# Patient Record
Sex: Male | Born: 1935 | Race: White | Hispanic: No | Marital: Married | Smoking: Former smoker
Health system: Southern US, Community
[De-identification: ages and names within clinical notes are randomized; demographics above are authoritative.]

## PROBLEM LIST (undated history)

## (undated) HISTORY — PX: CERVICAL DISCECTOMY: SHX98

## (undated) HISTORY — PX: CATARACT EXTRACTION: SUR2

---

## 2004-08-13 ENCOUNTER — Ambulatory Visit: Payer: Self-pay | Admitting: Internal Medicine

## 2005-06-02 ENCOUNTER — Ambulatory Visit: Payer: Self-pay | Admitting: Internal Medicine

## 2005-06-18 ENCOUNTER — Ambulatory Visit: Payer: Self-pay | Admitting: Internal Medicine

## 2005-07-30 ENCOUNTER — Ambulatory Visit: Payer: Self-pay | Admitting: Internal Medicine

## 2006-09-19 ENCOUNTER — Ambulatory Visit: Payer: Self-pay | Admitting: Internal Medicine

## 2007-04-24 ENCOUNTER — Encounter: Payer: Self-pay | Admitting: Internal Medicine

## 2007-05-16 ENCOUNTER — Telehealth: Payer: Self-pay | Admitting: Internal Medicine

## 2008-01-25 ENCOUNTER — Encounter: Payer: Self-pay | Admitting: Internal Medicine

## 2008-05-29 ENCOUNTER — Ambulatory Visit: Payer: Self-pay | Admitting: Internal Medicine

## 2008-05-29 DIAGNOSIS — Z85828 Personal history of other malignant neoplasm of skin: Secondary | ICD-10-CM

## 2008-05-29 DIAGNOSIS — K5730 Diverticulosis of large intestine without perforation or abscess without bleeding: Secondary | ICD-10-CM | POA: Insufficient documentation

## 2008-06-04 ENCOUNTER — Encounter (INDEPENDENT_AMBULATORY_CARE_PROVIDER_SITE_OTHER): Payer: Self-pay | Admitting: *Deleted

## 2008-06-12 ENCOUNTER — Encounter (INDEPENDENT_AMBULATORY_CARE_PROVIDER_SITE_OTHER): Payer: Self-pay | Admitting: *Deleted

## 2008-06-14 ENCOUNTER — Encounter (INDEPENDENT_AMBULATORY_CARE_PROVIDER_SITE_OTHER): Payer: Self-pay | Admitting: *Deleted

## 2008-08-05 ENCOUNTER — Telehealth (INDEPENDENT_AMBULATORY_CARE_PROVIDER_SITE_OTHER): Payer: Self-pay | Admitting: *Deleted

## 2008-08-06 ENCOUNTER — Telehealth (INDEPENDENT_AMBULATORY_CARE_PROVIDER_SITE_OTHER): Payer: Self-pay | Admitting: *Deleted

## 2009-02-07 ENCOUNTER — Encounter: Payer: Self-pay | Admitting: Internal Medicine

## 2009-12-20 ENCOUNTER — Encounter: Payer: Self-pay | Admitting: Internal Medicine

## 2009-12-20 ENCOUNTER — Emergency Department (HOSPITAL_COMMUNITY): Admission: EM | Admit: 2009-12-20 | Discharge: 2009-12-21 | Payer: Self-pay | Admitting: Emergency Medicine

## 2010-02-26 ENCOUNTER — Encounter: Payer: Self-pay | Admitting: Internal Medicine

## 2010-07-27 ENCOUNTER — Ambulatory Visit: Payer: Self-pay | Admitting: Internal Medicine

## 2010-08-14 ENCOUNTER — Ambulatory Visit: Payer: Self-pay | Admitting: Internal Medicine

## 2010-08-14 ENCOUNTER — Encounter: Payer: Self-pay | Admitting: Internal Medicine

## 2010-08-14 DIAGNOSIS — E785 Hyperlipidemia, unspecified: Secondary | ICD-10-CM | POA: Insufficient documentation

## 2010-08-14 DIAGNOSIS — Z860100 Personal history of colon polyps, unspecified: Secondary | ICD-10-CM | POA: Insufficient documentation

## 2010-08-14 LAB — CONVERTED CEMR LAB
HDL goal, serum: 40 mg/dL
LDL Goal: 160 mg/dL

## 2010-08-20 ENCOUNTER — Ambulatory Visit: Payer: Self-pay | Admitting: Internal Medicine

## 2010-08-24 LAB — CONVERTED CEMR LAB
Alkaline Phosphatase: 48 units/L (ref 39–117)
BUN: 13 mg/dL (ref 6–23)
Bilirubin, Direct: 0.2 mg/dL (ref 0.0–0.3)
Calcium: 8.8 mg/dL (ref 8.4–10.5)
Chloride: 101 meq/L (ref 96–112)
Creatinine, Ser: 0.9 mg/dL (ref 0.4–1.5)
Eosinophils Absolute: 0.1 10*3/uL (ref 0.0–0.7)
Eosinophils Relative: 2.5 % (ref 0.0–5.0)
GFR calc non Af Amer: 85.1 mL/min (ref >60)
Glucose, Bld: 84 mg/dL (ref 70–99)
HCT: 45.5 % (ref 39.0–52.0)
Lymphocytes Relative: 28.1 % (ref 12.0–46.0)
Lymphs Abs: 1.5 10*3/uL (ref 0.7–4.0)
Monocytes Absolute: 0.6 10*3/uL (ref 0.1–1.0)
Monocytes Relative: 10.6 % (ref 3.0–12.0)
Neutro Abs: 3.2 10*3/uL (ref 1.4–7.7)
Platelets: 196 10*3/uL (ref 150.0–400.0)
Potassium: 4.2 meq/L (ref 3.5–5.1)
RDW: 13.5 % (ref 11.5–14.6)
Sodium: 139 meq/L (ref 135–145)
TSH: 2.09 microintl units/mL (ref 0.35–5.50)
Total Bilirubin: 0.9 mg/dL (ref 0.3–1.2)
Total CHOL/HDL Ratio: 2
Total Protein: 6.3 g/dL (ref 6.0–8.3)

## 2010-10-25 ENCOUNTER — Encounter: Payer: Self-pay | Admitting: Emergency Medicine

## 2010-11-01 LAB — CONVERTED CEMR LAB
AST: 26 units/L (ref 0–37)
BUN: 9 mg/dL (ref 6–23)
Basophils Absolute: 0 10*3/uL (ref 0.0–0.1)
CO2: 33 meq/L — ABNORMAL HIGH (ref 19–32)
Calcium: 9.1 mg/dL (ref 8.4–10.5)
Chloride: 110 meq/L (ref 96–112)
Creatinine, Ser: 1 mg/dL (ref 0.4–1.5)
Eosinophils Relative: 2.6 % (ref 0.0–5.0)
Hemoglobin: 15.5 g/dL (ref 13.0–17.0)
Lymphocytes Relative: 31.3 % (ref 12.0–46.0)
Neutro Abs: 2.4 10*3/uL (ref 1.4–7.7)
Neutrophils Relative %: 55.6 % (ref 43.0–77.0)
RBC: 4.89 M/uL (ref 4.22–5.81)
RDW: 12.3 % (ref 11.5–14.6)
Sodium: 144 meq/L (ref 135–145)
TSH: 1.62 microintl units/mL (ref 0.35–5.50)
WBC: 4.2 10*3/uL — ABNORMAL LOW (ref 4.5–10.5)

## 2010-11-03 NOTE — Assessment & Plan Note (Signed)
Summary: FLU SHOT///SPH  Nurse Visit  CC: Flu shot./kb   Orders Added: 1)  Admin 1st Vaccine [90471] 2)  Flu Vaccine 64yrs + [72536]       Flu Vaccine Consent Questions     Do you have a history of severe allergic reactions to this vaccine? no    Any prior history of allergic reactions to egg and/or gelatin? no    Do you have a sensitivity to the preservative Thimersol? no    Do you have a past history of Guillan-Barre Syndrome? no    Do you currently have an acute febrile illness? no    Have you ever had a severe reaction to latex? no    Vaccine information given and explained to patient? yes    Are you currently pregnant? no    Lot Number:[id number]   Exp Date:04/03/2011   Site Given  Right Deltoid IM

## 2010-11-03 NOTE — Miscellaneous (Signed)
Summary: [facility]  [facility]   Imported By: Lanelle Bal 12/25/2009 10:28:44  _____________________________________________________________________  External Attachment:    Type:   Image     Comment:   External Document

## 2010-11-03 NOTE — Letter (Signed)
Summary: [facility]  [facility]   Imported By: Lanelle Bal 03/09/2010 14:05:09  _____________________________________________________________________  External Attachment:    Type:   Image     Comment:   External Document

## 2010-11-03 NOTE — Assessment & Plan Note (Signed)
Summary: YEARLY--PNEUMONIA//PH   Vital Signs:  Patient profile:   77 year old male Height:      69.25 inches Weight:      189.6 pounds BMI:     27.90 Temp:     98.3 degrees F oral Pulse rate:   76 / minute Resp:     14 per minute BP sitting:   124 / 62  (left arm) Cuff size:   large  Vitals Entered By: Shonna Chock CMA (August 14, 2010 1:23 PM) CC: Yearly follow-up and pneumonia vaccine, Lipid Management   CC:  Yearly follow-up and pneumonia vaccine and Lipid Management.  History of Present Illness: Here for Medicare AWV: 1.   Risk factors based on Past M, S, F history:Remote history radiation exposure; Diverticulosis;  colon polyps; Dyslipidemia ( chart updated) 2.   Physical Activities:  treadmill @ varible speeds & incline 25 min 6X/ week 3.   Depression/mood: no issue 4.   Hearing: whisper @ 6 ft  intact  5.   ADL's: no limitations 6.   Fall Risk: none  7.   Home Safety:safety proofed  8.   Height, weight, &visual acuity:vision intact @ 6 ft with lenses 9.   Counseling: POA & Living Will in place 10.   Labs ordered based on risk factors: see Orders  11.           Referral Coordination: none requested  12.           Care Plan: see Instructions  13.            Cognitive Assessment: Oriented X 3;  memory & recall excellent  ; "WORLD" spelled backwards ; mood & affect intact. Hyperlipidemia Follow-Up       The patient denies the following symptoms: chest pain/pressure, exercise intolerance, dypsnea, palpitations, syncope, and pedal edema.  Dietary compliance has been good.  Adjunctive measures currently used by the patient include niacin, fish oil supplements, and Co-QA.   NMR Lipoprofile Panel reviewed .[place] Study LDL = < 160.  Lipid Management History:      Positive NCEP/ATP III risk factors include male age 64 years old or older.  Negative NCEP/ATP III risk factors include non-diabetic, no family history for ischemic heart disease, non-tobacco-user status,  non-hypertensive, no ASHD (atherosclerotic heart disease), no prior stroke/TIA, no peripheral vascular disease, and no history of aortic aneurysm.     Preventive Screening-Counseling & Management  Alcohol-Tobacco     Alcohol drinks/day: 2-3     Year Quit: 1980  Caffeine-Diet-Exercise     Caffeine use/day: 2 cups or <  Hep-HIV-STD-Contraception     Dental Visit-last 6 months yes     Sun Exposure-Excessive: no  Safety-Violence-Falls     Seat Belt Use: yes     Smoke Detectors: yes      Blood Transfusions:  no.        Travel History:  [place] 2010.    Current Medications (verified): 1)  Cialis 20 Mg Tabs (Tadalafil) .... 1/2 To 1 Every 3 Days As Needed 2)  Selenium 100 Mcg Tabs (Selenium) .Marland Kitchen.. 1 By Mouth Once Daily 3)  Fish Oil 1000 Mg Caps (Omega-3 Fatty Acids) .Marland Kitchen.. 1 By Mouth Once Daily 4)  Coq10 50 Mg Caps (Coenzyme Q10) .Marland Kitchen.. 1 By Mouth Once Daily 5)  Grape Seed Extract 60 Mg Caps (Grape Seed) .Marland Kitchen.. 1 By Mouth Once Daily 6)  Saw Palmetto 500 Mg Caps (Saw Palmetto (Serenoa Repens)) .Marland Kitchen.. 1 By Mouth Once Daily 7)  Copper Caps 2 Mg Caps (Copper Gluconate) .Marland Kitchen.. 1 By Mouth Once Daily 8)  Calcium/magnesium/zinc .... 1 By Mouth  Once Daily 9)  Nicacinamide .Marland Kitchen.. 1 By Mouth Once Daily  Allergies (verified): No Known Drug Allergies  Past History:  Past Medical History: Gilbert's Syndrome ; Fuch's Disease (Ophth), Dr Elmer Picker; PMH prostatitis X 2, Dr [place] Service Skin cancer, PMH  of basal cell cancer  Diverticulosis, colon Colonic polyps,PMH  of Hyperlipidemia: [place] Study LDL goal = < 160. NMR Lipoprofile suggests LDL goal = < 100 optimally.  Past Surgical History: Cervical fusion & Cervical laminectomy 1998, Dr Venetia Maxon Colon polypectomy, Tics 2005; negative  2008  Tonsillectomy Cataract extraction bilaterally , Dr Elmer Picker Facial vascular Nevus treated with Radium in 09-15-30  Family History: Father: oral  cancer Mother: CVAs in 80s,HTN Siblings: bro: DM, PVD ; bro  :cns  cancer  & asthma; sister: DJD  Social History: Occupation: Attorney Married Alcohol use-yes: socially Caffeine use/day:  2 cups or < Dental Care w/in 6 mos.:  yes Sun Exposure-Excessive:  no Risk analyst Use:  yes Blood Transfusions:  no  Review of Systems  The patient denies anorexia, fever, weight loss, weight gain, hoarseness, prolonged cough, hemoptysis, abdominal pain, melena, hematochezia, severe indigestion/heartburn, hematuria, suspicious skin lesions, unusual weight change, abnormal bleeding, enlarged lymph nodes, and angioedema.    Physical Exam  General:  well-nourished;alert,appropriate and cooperative throughout examination Head:  Normocephalic and atraumatic without obvious abnormalities.  Eyes:  No corneal or conjunctival inflammation noted.Perrla. Funduscopic exam benign, without hemorrhages, exudates or papilledema. Ears:  External ear exam shows no significant lesions or deformities.  Otoscopic examination reveals clear canals, tympanic membranes are intact bilaterally without bulging, retraction, inflammation or discharge. Hearing is grossly normal bilaterally. Nose:  External nasal examination shows no deformity or inflammation. Nasal mucosa are pink and moist without lesions or exudates. Mouth:  Oral mucosa and oropharynx without lesions or exudates.  Teeth in good repair. Neck:  No deformities, masses, or tenderness noted. Lungs:  Normal respiratory effort, chest expands symmetrically. Lungs are clear to auscultation, no crackles or wheezes. Heart:  Normal rate and regular rhythm. S1 and S2 normal without gallop, murmur, click, rub or other extra sounds. Abdomen:  Bowel sounds positive,abdomen soft and non-tender without masses, organomegaly or hernias noted. Lipoma R abdomen which  transilluminates Genitalia:  [facility]:  No deformity or scoliosis noted of thoracic or lumbar spine.   Pulses:  R and L carotid,radial,dorsalis pedis and posterior tibial  pulses are full and equal bilaterally Extremities:  No clubbing, cyanosis, edema. Deformed R 5th digit. Minimal OA changes in hands. Ganglion cysts L medial foot Neurologic:  alert & oriented X3 and DTRs symmetrical and normal.   Skin:  Intact without suspicious lesions or rashes Cervical Nodes:  No lymphadenopathy noted Axillary Nodes:  No palpable lymphadenopathy Psych:  memory intact for recent and remote, normally interactive, and good eye contact.     Impression & Recommendations:  Problem # 1:  PREVENTIVE HEALTH CARE (ICD-V70.0)  Orders: [facility] -Subsequent Annual Wellness Visit [phone])  Problem # 2:  HYPERLIPIDEMIA (ICD-272.4)   Minimal LDL goal = < 160   Orders: EKG w/ Interpretation (93000)  Problem # 3:  EXPOSURE TO UNSPECIFIED RADIATION (ICD-E926.9) L face in 1931  Problem # 4:  COLONIC POLYPS, HX OF (ICD-V12.72) as per GI  Complete Medication List: 1)  Cialis 20 Mg Tabs (Tadalafil) .... 1/2 to 1 every 3 days as needed 2)  Selenium 100 Mcg  Tabs (Selenium) .Marland Kitchen.. 1 by mouth once daily 3)  Fish Oil 1000 Mg Caps (Omega-3 fatty acids) .Marland Kitchen.. 1 by mouth once daily 4)  Coq10 50 Mg Caps (Coenzyme q10) .Marland Kitchen.. 1 by mouth once daily 5)  Grape Seed Extract 60 Mg Caps (Grape seed) .Marland Kitchen.. 1 by mouth once daily 6)  Saw Palmetto 500 Mg Caps (Saw palmetto (serenoa repens)) .Marland Kitchen.. 1 by mouth once daily 7)  Copper Caps 2 Mg Caps (Copper gluconate) .Marland Kitchen.. 1 by mouth once daily 8)  Calcium/magnesium/zinc  .... 1 by mouth  once daily 9)  Nicacinamide  .Marland KitchenMarland Kitchen. 1 by mouth once daily  Other Orders: Pneumococcal Vaccine (56433) Admin 1st Vaccine (29518)  Lipid Assessment/Plan:      Based on NCEP/ATP III, the patient's risk factor category is "0-1 risk factors".  The patient's lipid goals are as follows: Total cholesterol goal is 200; LDL cholesterol goal is 160; HDL cholesterol goal is 40; Triglyceride goal is 150.    Patient Instructions: 1)  Schedue fasting labs : 2)  It is important that you  exercise regularly at least 20 minutes 5 times a week. If you develop chest pain, have severe difficulty breathing, or feel very tired , stop exercising immediately and seek medical attention. 3)  Take an  81 mg coated Aspirin every day. 4)  BMP : 272.4 5)  Hepatic Panel: 272.4 6)  Lipid Panel :272.5 7)  TSH : 272.4,E926.9 8)  CBC w/ Diff :211.3   Orders Added: 1)  Pneumococcal Vaccine [90732] 2)  Admin 1st Vaccine [90471] 3)  [facility] -Subsequent Annual Wellness Visit [G0439] 4)  Est. Patient Level III [84166] 5)  EKG w/ Interpretation [93000]   Immunizations Administered:  Pneumonia Vaccine:    Vaccine Type: Pneumovax (Medicare)    Site: right deltoid    Mfr: Merck    Dose: 0.5 ml    Route: IM    Given by: Shonna Chock CMA    Exp. Date: 12/19/2011    Lot #: [id number]   Immunizations Administered:  Pneumonia Vaccine:    Vaccine Type: Pneumovax (Medicare)    Site: right deltoid    Mfr: Merck    Dose: 0.5 ml    Route: IM    Given by: Shonna Chock CMA    Exp. Date: 12/19/2011    Lot #: [id number]

## 2012-11-04 ENCOUNTER — Encounter (HOSPITAL_BASED_OUTPATIENT_CLINIC_OR_DEPARTMENT_OTHER): Payer: Self-pay

## 2012-11-04 ENCOUNTER — Emergency Department (HOSPITAL_BASED_OUTPATIENT_CLINIC_OR_DEPARTMENT_OTHER)
Admission: EM | Admit: 2012-11-04 | Discharge: 2012-11-04 | Disposition: A | Discharge status: Home / Self Care | Payer: Medicare Other | Attending: Emergency Medicine | Admitting: Emergency Medicine

## 2012-11-04 DIAGNOSIS — M26609 Unspecified temporomandibular joint disorder, unspecified side: Secondary | ICD-10-CM | POA: Insufficient documentation

## 2012-11-04 DIAGNOSIS — Z87891 Personal history of nicotine dependence: Secondary | ICD-10-CM | POA: Insufficient documentation

## 2012-11-04 DIAGNOSIS — R52 Pain, unspecified: Secondary | ICD-10-CM | POA: Insufficient documentation

## 2012-11-04 NOTE — ED Notes (Signed)
Pt states that he woke up with localized swelling to the R cheek, and it has since improved greatly, pain decr from onset this morning.  Pt states that it was very tender at onset, and continues to be sensitive.

## 2012-11-04 NOTE — ED Provider Notes (Signed)
History     CSN: [id number]  Arrival date & time 11/04/12  1018   First MD Initiated Contact with Patient 11/04/12 1031      Chief Complaint  Patient presents with  . Facial Swelling    HPI The patient had an episode of acute pain this morning after yawning in his right TMJ region. The pain was sharp and severe.  It started this morning but has gradually resolved. Patient denies any other symptoms prior to this occurring. He's not having any fevers or difficulty swallowing. The patient noticed that that right TMJ region was red and swollen after it . his wife took a picture of it.  The patient has been able to eat since that time. He occasionally has some pain when he opens his jaw widely but otherwise denies any other regular symptoms.  The symptoms all much resolved at this point.  Patient wanted to get it checked out he was concerned about the possibility of a ruptured blood vessel. He did note that when he took his blood pressure was elevated. Generally he does not have any trouble with hypertension. History reviewed. No pertinent past medical history.  Past Surgical History  Procedure Date  . Cervical discectomy     anterior approach  . Cataract extraction     History reviewed. No pertinent family history.  History  Substance Use Topics  . Smoking status: Former Smoker    Quit date: 10/04/1978  . Smokeless tobacco: Never Used  . Alcohol Use: 1.8 oz/week    3 Glasses of wine per week      Review of Systems  Constitutional: Negative for fever.  HENT: Negative for drooling, mouth sores, trouble swallowing and dental problem.   Respiratory: Negative for shortness of breath.   All other systems reviewed and are negative.    Allergies  Review of patient's allergies indicates no known allergies.  Home Medications  No current outpatient prescriptions on file.  BP 162/66  Pulse 81  Temp 97.5 F (36.4 C) (Oral)  Resp 20  Ht 5\' 10"  (1.778 m)  Wt 192 lb (87.091 kg)   BMI 27.55 kg/m2  SpO2 99%  Physical Exam  Nursing note and vitals reviewed. Constitutional: He appears well-developed and well-nourished. No distress.  HENT:  Head: Normocephalic and atraumatic. No trismus in the jaw.  Right Ear: External ear normal.  Left Ear: External ear normal.  Mouth/Throat: Oropharynx is clear and moist and mucous membranes are normal. Normal dentition. No dental abscesses or uvula swelling.       Mild ttp right tmj joint, full range of motion, no malocclusion  Eyes: Conjunctivae normal are normal. Right eye exhibits no discharge. Left eye exhibits no discharge. No scleral icterus.  Neck: Neck supple. No tracheal deviation present.  Cardiovascular: Normal rate.   Pulmonary/Chest: Effort normal. No stridor. No respiratory distress.  Musculoskeletal: He exhibits no edema.  Neurological: He is alert. Cranial nerve deficit: no gross deficits.  Skin: Skin is warm and dry. No rash noted.  Psychiatric: He has a normal mood and affect.    ED Course  Procedures (including critical care time)  Labs Reviewed - No data to display No results found.   1. TMJ (temporomandibular joint disorder)       MDM  Patient is no evidence of infection on exam. He has normal dentition. I am not concerned that this is some sort of referred pain such as an associated cardiac condition and that he did noticed swelling and  had discrete tenderness at the right TMJ joint. I suspect he may have had some pain associated with a partial TMJ joint dislocation/subluxation. The patient did notice the discomfort immediately after yawning. He did feel that it was swollen and red. It is now essentially resolved. I explained to him that an acute blood vessel issue or acute infection or not spontaneously get better.  I recommended observation and followup with his primary doctor or dentist. Return to emergency room as needed for any worsening symptoms. I recommended avoid opening his mouth widely over  the next few days.        Celene Kras, MD 11/04/12 [phone]

## 2013-05-04 ENCOUNTER — Emergency Department (HOSPITAL_BASED_OUTPATIENT_CLINIC_OR_DEPARTMENT_OTHER)
Admission: EM | Admit: 2013-05-04 | Discharge: 2013-05-04 | Disposition: A | Discharge status: Home / Self Care | Payer: Medicare Other | Attending: Emergency Medicine | Admitting: Emergency Medicine

## 2013-05-04 ENCOUNTER — Encounter (HOSPITAL_BASED_OUTPATIENT_CLINIC_OR_DEPARTMENT_OTHER): Payer: Self-pay | Admitting: *Deleted

## 2013-05-04 ENCOUNTER — Emergency Department (HOSPITAL_BASED_OUTPATIENT_CLINIC_OR_DEPARTMENT_OTHER): Payer: Medicare Other

## 2013-05-04 DIAGNOSIS — R51 Headache: Secondary | ICD-10-CM | POA: Insufficient documentation

## 2013-05-04 DIAGNOSIS — J40 Bronchitis, not specified as acute or chronic: Secondary | ICD-10-CM

## 2013-05-04 DIAGNOSIS — Z87891 Personal history of nicotine dependence: Secondary | ICD-10-CM | POA: Insufficient documentation

## 2013-05-04 DIAGNOSIS — J209 Acute bronchitis, unspecified: Secondary | ICD-10-CM | POA: Insufficient documentation

## 2013-05-04 MED ORDER — AZITHROMYCIN 250 MG PO TABS: ORAL_TABLET | ORAL | Status: DC

## 2013-05-04 NOTE — ED Provider Notes (Signed)
  CSN: [id number]     Arrival date & time 05/04/13  1605 History     First MD Initiated Contact with Patient 05/04/13 1629     Chief Complaint  Patient presents with  . URI   (Consider location/radiation/quality/duration/timing/severity/associated sxs/prior Treatment) Patient is a 79 y.o. male presenting with cough. The history is provided by the patient. No language interpreter was used.  Cough Cough characteristics:  Productive Sputum characteristics:  Nondescript Severity:  Moderate Onset quality:  Gradual Duration:  10 days Timing:  Constant Progression:  Worsening Chronicity:  New Smoker: no   Context: upper respiratory infection   Relieved by:  Nothing Associated symptoms: headaches   Associated symptoms: no chest pain and no fever   Pt report he has been coughinh gor the past 10 days  History reviewed. No pertinent past medical history. Past Surgical History  Procedure Laterality Date  . Cervical discectomy      anterior approach  . Cataract extraction     No family history on file. History  Substance Use Topics  . Smoking status: Former Smoker    Quit date: 10/04/1978  . Smokeless tobacco: Never Used  . Alcohol Use: 1.8 oz/week    3 Glasses of wine per week    Review of Systems  Constitutional: Negative for fever.  Respiratory: Positive for cough.   Cardiovascular: Negative for chest pain.  Neurological: Positive for headaches.  All other systems reviewed and are negative.    Allergies  Review of patient's allergies indicates no known allergies.  Home Medications  No current outpatient prescriptions on file. BP 160/70  Pulse 80  Temp(Src) 98.2 F (36.8 C) (Oral)  Resp 18  Wt 187 lb (84.823 kg)  BMI 26.83 kg/m2  SpO2 99% Physical Exam  Vitals reviewed. Constitutional: He appears well-developed and well-nourished.  HENT:  Head: Normocephalic.  Right Ear: External ear normal.  Left Ear: External ear normal.  Nose: Nose normal.   Mouth/Throat: Oropharynx is clear and moist.  Eyes: Pupils are equal, round, and reactive to light.  Neck: Normal range of motion.  Cardiovascular: Normal rate.   Pulmonary/Chest: Effort normal.  Abdominal: Soft.  Musculoskeletal: Normal range of motion.  Neurological: He is alert.  Skin: Skin is warm.    ED Course   Procedures (including critical care time)  Labs Reviewed - No data to display Dg Chest 2 View  05/04/2013   *RADIOLOGY REPORT*  Clinical Data: 10-day history of cough and rhinitis.  CHEST - 2 VIEW  Comparison: None.  Findings: Cardiomediastinal silhouette unremarkable for age, with only mild thoracic aortic atherosclerosis.   Lungs clear. Bronchovascular markings normal.  Pulmonary vascularity normal.  No pneumothorax.  No pleural effusions.  Degenerative changes and DISH involving the thoracic spine.  Old healed fractures involving posterior left ribs.  IMPRESSION: No acute cardiopulmonary disease.   Original Report Authenticated By: Hulan Saas, M.D.   1. Bronchitis     MDM  zithromax   Lonia Skinner [place], [place] 05/04/13 [phone]

## 2013-05-04 NOTE — ED Provider Notes (Signed)
Medical screening examination/treatment/procedure(s) were performed by non-physician practitioner and as supervising physician I was immediately available for consultation/collaboration.   Gavin Pound. Oletta Lamas, MD 05/04/13 2002

## 2013-05-04 NOTE — ED Notes (Signed)
Coughing x 10 days. Runny nose. No fever. Denies headache. States he has a summer cold.

## 2016-07-20 ENCOUNTER — Other Ambulatory Visit: Payer: Self-pay | Admitting: Physician Assistant

## 2016-07-20 DIAGNOSIS — R160 Hepatomegaly, not elsewhere classified: Secondary | ICD-10-CM

## 2016-08-04 ENCOUNTER — Ambulatory Visit
Admission: RE | Admit: 2016-08-04 | Discharge: 2016-08-04 | Disposition: A | Discharge status: Home / Self Care | Payer: Medicare Other | Source: Ambulatory Visit | Attending: Physician Assistant | Admitting: Physician Assistant

## 2016-08-04 DIAGNOSIS — R160 Hepatomegaly, not elsewhere classified: Secondary | ICD-10-CM

## 2016-08-04 MED ORDER — IOPAMIDOL (ISOVUE-300) INJECTION 61%
100.0000 mL | Freq: Once | INTRAVENOUS | Status: AC | PRN
  Administered 2016-08-04: 100 mL via INTRAVENOUS

## 2016-08-06 ENCOUNTER — Other Ambulatory Visit (HOSPITAL_COMMUNITY): Payer: Self-pay | Admitting: Physician Assistant

## 2016-08-06 DIAGNOSIS — R160 Hepatomegaly, not elsewhere classified: Secondary | ICD-10-CM

## 2016-08-19 ENCOUNTER — Other Ambulatory Visit (HOSPITAL_COMMUNITY): Payer: Self-pay | Admitting: Physician Assistant

## 2016-08-19 DIAGNOSIS — R160 Hepatomegaly, not elsewhere classified: Secondary | ICD-10-CM

## 2016-08-23 ENCOUNTER — Telehealth: Payer: Self-pay | Admitting: *Deleted

## 2016-08-23 NOTE — Telephone Encounter (Signed)
Oncology Nurse Navigator Documentation  Oncology Nurse Navigator Flowsheets 08/23/2016  Navigator Location [facility]  Referral date to RadOnc/MedOnc 08/23/2016  Navigator Encounter Type Telephone  Telephone Incoming Call;Appt Confirmation/Clarification  Abnormal Finding Date 08/04/2016  Call from Dr. Barry Dienes to request putting patient in for [facility] on 09/03/16 to see Dr. Benay Spice and herself. Gave appointment with Dr. Benay Spice for 09/03/16 at 0830 and Dr. Barry Dienes at [facility]. She will tell patient today. Sent message to HIM to schedule in EPIC.

## 2016-08-30 ENCOUNTER — Other Ambulatory Visit: Payer: Self-pay | Admitting: Radiology

## 2016-08-31 ENCOUNTER — Ambulatory Visit (HOSPITAL_COMMUNITY): Admission: RE | Admit: 2016-08-31 | Payer: Medicare Other | Source: Ambulatory Visit

## 2016-08-31 ENCOUNTER — Encounter: Payer: Self-pay | Admitting: *Deleted

## 2016-08-31 NOTE — Progress Notes (Signed)
Notified by Raquel Sarna with [facility] that patient has decided against any treatment-surgery or chemotherapy. He has cancelled his appointments.

## 2016-09-03 ENCOUNTER — Ambulatory Visit: Payer: Medicare Other | Admitting: Oncology

## 2017-11-18 ENCOUNTER — Ambulatory Visit (INDEPENDENT_AMBULATORY_CARE_PROVIDER_SITE_OTHER): Payer: Medicare Other

## 2017-11-18 ENCOUNTER — Encounter (INDEPENDENT_AMBULATORY_CARE_PROVIDER_SITE_OTHER): Payer: Self-pay | Admitting: Orthopaedic Surgery

## 2017-11-18 ENCOUNTER — Ambulatory Visit (INDEPENDENT_AMBULATORY_CARE_PROVIDER_SITE_OTHER): Payer: Medicare Other | Admitting: Orthopaedic Surgery

## 2017-11-18 DIAGNOSIS — M7022 Olecranon bursitis, left elbow: Secondary | ICD-10-CM

## 2017-11-18 NOTE — Progress Notes (Signed)
Office Visit Note   Patient: Kenneth Woodward           Date of Birth: [birth year]           MRN: [id number] Visit Date: 11/18/2017              Requested by: Chesley Noon, MD [place], [place] [id number] PCP: Chesley Noon, MD   Assessment & Plan: Visit Diagnoses:  1. Olecranon bursitis of left elbow     Plan: Impression is left olecranon bursitis.  Recommend warm compresses and symptomatic treatment to see if this will improve it or  resolve in a couple weeks.  If not better we can consider aspiration cortisone injection.  Patient is in agreement.  Follow-up as needed.  Follow-Up Instructions: Return if symptoms worsen or fail to improve.   Orders:  Orders Placed This Encounter  Procedures  . XR Elbow 2 Views Left   No orders of the defined types were placed in this encounter.     Procedures: No procedures performed   Clinical Data: No additional findings.   Subjective: Chief Complaint  Patient presents with  . Left Elbow - Pain, Edema    Patient is a 84 year old gentleman who is well-known to me who comes in with swelling and mass over the left elbow.  He is not exactly sure how long he has had this.  His daughter noticed it.  Denies any pain.  Denies any constitutional symptoms.  Denies any trauma.    Review of Systems  Constitutional: Negative.   All other systems reviewed and are negative.    Objective: Vital Signs: There were no vitals taken for this visit.  Physical Exam  Constitutional: He is oriented to person, place, and time. He appears well-developed and well-nourished.  HENT:  Head: Normocephalic and atraumatic.  Eyes: Pupils are equal, round, and reactive to light.  Neck: Neck supple.  Pulmonary/Chest: Effort normal.  Abdominal: Soft.  Musculoskeletal: Normal range of motion.  Neurological: He is alert and oriented to person, place, and time.  Skin: Skin is warm.  Psychiatric: He has a normal mood and affect.  His behavior is normal. Judgment and thought content normal.  Nursing note and vitals reviewed.   Ortho Exam Left elbow exam shows a swollen olecranon bursa.  There is no overlying skin changes or cellulitis.  There is no tenderness to palpation.  Full function of the elbow. Specialty Comments:  No specialty comments available.  Imaging: Xr Elbow 2 Views Left  Result Date: 11/18/2017 Large soft tissue density overlying the olecranon.    PMFS History: Patient Active Problem List   Diagnosis Date Noted  . HYPERLIPIDEMIA 08/14/2010  . COLONIC POLYPS, HX OF 08/14/2010  . DIVERTICULOSIS, COLON 05/29/2008  . SKIN CANCER, HX OF 05/29/2008   No past medical history on file.  No family history on file.  Past Surgical History:  Procedure Laterality Date  . CATARACT EXTRACTION    . CERVICAL DISCECTOMY     anterior approach   Social History   Occupational History  . Not on file  Tobacco Use  . Smoking status: Former Smoker    Last attempt to quit: 10/04/1978    Years since quitting: 39.1  . Smokeless tobacco: Never Used  Substance and Sexual Activity  . Alcohol use: Yes    Alcohol/week: 1.8 oz    Types: 3 Glasses of wine per week  . Drug use: No  . Sexual activity: Not on file

## 2018-05-03 ENCOUNTER — Telehealth: Payer: Self-pay | Admitting: Nurse Practitioner

## 2018-05-03 ENCOUNTER — Encounter: Payer: Self-pay | Admitting: Nurse Practitioner

## 2018-05-03 ENCOUNTER — Inpatient Hospital Stay: Payer: Medicare Other | Attending: Nurse Practitioner | Admitting: Nurse Practitioner

## 2018-05-03 ENCOUNTER — Inpatient Hospital Stay: Payer: Medicare Other

## 2018-05-03 VITALS — BP 135/57 | HR 76 | Temp 98.4°F | Resp 19 | Ht 70.0 in | Wt 170.4 lb

## 2018-05-03 DIAGNOSIS — R61 Generalized hyperhidrosis: Secondary | ICD-10-CM | POA: Diagnosis not present

## 2018-05-03 DIAGNOSIS — C220 Liver cell carcinoma: Secondary | ICD-10-CM | POA: Insufficient documentation

## 2018-05-03 DIAGNOSIS — Z87891 Personal history of nicotine dependence: Secondary | ICD-10-CM | POA: Diagnosis not present

## 2018-05-03 LAB — CMP (CANCER CENTER ONLY)
ALK PHOS: 131 U/L — AB (ref 38–126)
ALT: 60 U/L — ABNORMAL HIGH (ref 0–44)
AST: 91 U/L — ABNORMAL HIGH (ref 15–41)
Albumin: 3.9 g/dL (ref 3.5–5.0)
Anion gap: 8 (ref 5–15)
BUN: 18 mg/dL (ref 8–23)
CALCIUM: 9.4 mg/dL (ref 8.9–10.3)
CHLORIDE: 103 mmol/L (ref 98–111)
CO2: 29 mmol/L (ref 22–32)
Creatinine: 0.89 mg/dL (ref 0.61–1.24)
GFR, Estimated: >60 mL/min (ref >60)
Glucose, Bld: 93 mg/dL (ref 70–99)
Potassium: 4.2 mmol/L (ref 3.5–5.1)
SODIUM: 140 mmol/L (ref 135–145)
Total Bilirubin: 0.7 mg/dL (ref 0.3–1.2)
Total Protein: 7.2 g/dL (ref 6.5–8.1)

## 2018-05-03 LAB — CBC WITH DIFFERENTIAL (CANCER CENTER ONLY)
BASOS PCT: 1 %
Basophils Absolute: 0 10*3/uL (ref 0.0–0.1)
Eosinophils Absolute: 0.2 10*3/uL (ref 0.0–0.5)
Eosinophils Relative: 3 %
HCT: 34 % — ABNORMAL LOW (ref 38.4–49.9)
Hemoglobin: 11.1 g/dL — ABNORMAL LOW (ref 13.0–17.1)
Lymphocytes Relative: 21 %
Lymphs Abs: 1.2 10*3/uL (ref 0.9–3.3)
MCH: 28.2 pg (ref 27.2–33.4)
MCHC: 32.5 g/dL (ref 32.0–36.0)
MCV: 86.6 fL (ref 79.3–98.0)
MONOS PCT: 10 %
Monocytes Absolute: 0.6 10*3/uL (ref 0.1–0.9)
NEUTROS ABS: 3.8 10*3/uL (ref 1.5–6.5)
Neutrophils Relative %: 65 %
Platelet Count: 191 10*3/uL (ref 140–400)
RBC: 3.93 MIL/uL — ABNORMAL LOW (ref 4.20–5.82)
RDW: 15 % — ABNORMAL HIGH (ref 11.0–14.6)
WBC Count: 5.8 10*3/uL (ref 4.0–10.3)

## 2018-05-03 LAB — PROTIME-INR
INR: 0.99
PROTHROMBIN TIME: 13 s (ref 11.4–15.2)

## 2018-05-03 NOTE — Telephone Encounter (Signed)
New referral received [facility] for dx of liver mass. Pt has been scheduled to see Marlynn Perking today at 215pm. Pt aware to arrive 30 minutes early.

## 2018-05-03 NOTE — Progress Notes (Addendum)
New Hematology/Oncology Consult   Requesting MD: Dr. Anastasia Pall  [phone]    Reason for Consult: Hepatocellular carcinoma  HPI: Mr. Hewins is an 84 year old man recently referred for evaluation of newly diagnosed hepatocellular carcinoma.  He had a CT scan of the abdomen/pelvis 08/04/2016 that showed multiple left hepatic lobe lesions.  The largest lesion measured 8.3 x 6.9 cm and was centered segment 4 of the liver.  Smaller lesion exophytic from the inferior anterior margin of the hepatic lobe measuring 3.5 x 3.2 cm.  Contiguous 3.6 cm exophytic lesion along the falciform ligament.  Cystic lesion in the right hepatic lobe without enhancement.  Additional cystic lesion in the central right hepatic lobe with enhancing septation measuring 19 mm.  Multiple additional cystic lesions noted. There was a soft tissue implant in the subcutaneous tissue of the right abdominal wall measuring 2.6 cm.  He declined further evaluation.  He had a CT scan at [facility] on 04/04/2018 showing a very large mass spanning the right and left lobes of the liver measuring approximately 15.2 x 10.7 x 14.8 cm.  Additional smaller masses noted adjacent to the primary lesion, one lateral to the lesion measuring 3.5 x 3 cm and one lesion possibly contiguous with the primary mass measuring 6.2 x 3.8 cm.  All of these had increased in size when compared to the previous examination.  There was a cystic lesion in the lateral right lobe of the liver with multiple septations and some calcifications within the septations.  This lesion was not significantly changed in size from the prior examination.  There was a soft tissue mass in the subcutaneous tissues of the anterior abdominal wall to the right of midline just below the umbilicus, not significantly changed from the prior.  He underwent liver mass biopsy on 04/27/2018.  Final pathology showed hepatocellular carcinoma.     History reviewed. No pertinent past medical  history.:  Past Surgical History:  Procedure Laterality Date  . CATARACT EXTRACTION    . CERVICAL DISCECTOMY     anterior approach  :  No current outpatient medications on file.:  :  Allergies  Allergen Reactions  . Adhesive [Tape] Other (See Comments)    Band-aids irritate skin. Ok to use gauze with paper tape  :  FH: Father deceased with cancer at age 27.  Mother deceased age 60 as well.  Brother deceased with a brain tumor.  Brother deceased, health described as poor.  SOCIAL HISTORY: Mr. Jolley lives in [place].  He is married.  He has 2 children.  His daughter has Parkinson's disease.  He is employed as an Forensic psychologist.  He quit smoking cigarettes in 1980.  He drinks 1 to 2 glasses of wine a day.  He has never had a blood transfusion.  Review of Systems: He reports intermittent drenching night sweats for the past 6 months.  The sweats mainly affect his chest area.  No associated fever.  He describes a good appetite.  His wife has noted that he is eating less. She thinks he has lost some weight.  He denies pain.  No unusual headaches.  He notes gradual decrease in vision. 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  No dysphagia.  He has dyspnea on exertion.  No chest pain.  No leg swelling or calf pain.  No nausea or vomiting.  No constipation or diarrhea.  He has nocturia.  No hematuria or dysuria.  No rash.  No numbness or  tingling in his hands or feet.   Physical Exam:  Blood pressure (!) 135/57, pulse 76, temperature 98.4 F (36.9 C), temperature source Oral, resp. rate 19, height 5\' 10"  (1.778 m), weight 170 lb 6.4 oz (77.3 kg), SpO2 96 %.  HEENT: No thrush or ulcers. Lungs: Lungs clear bilaterally. Cardiac: Regular rate and rhythm. Abdomen: Liver palpable throughout the right upper abdomen.  Palpable mass upper mid abdomen.  Vascular: Trace edema at the lower leg/ankle bilaterally. Lymph nodes: No palpable cervical, supraclavicular  or axillary lymph nodes. Neurologic: Alert and oriented.  Follows commands.  Motor strength 5/5. Skin: Approximate 2 cm subcutaneous lesion right mid/lower abdominal wall.  LABS:  None  Assessment and Plan:  1. Hepatocellular carcinoma  CT scan of the abdomen/pelvis 08/04/2016-multiple left hepatic lobe lesions.  The largest lesion measured 8.3 x 6.9 cm and was centered segment 4 of the liver.  Smaller lesion exophytic from the inferior anterior margin of the hepatic lobe measuring 3.5 x 3.2 cm.  Contiguous 3.6 cm exophytic lesion along the falciform ligament.  Cystic lesion in the right hepatic lobe without enhancement.  Additional cystic lesion in the central right hepatic lobe with enhancing septation measuring 19 mm.  Multiple additional cystic lesions noted. There was a soft tissue implant in the subcutaneous tissue of the right abdominal wall measuring 2.6 cm.    CT scan abdomen[facility]pelvis [facility] 04/04/2018-very large mass spanning the right and left lobes of the liver measuring approximately 15.2 x 10.7 x 14.8 cm.  Additional smaller masses noted adjacent to the primary lesion, one lateral to the lesion measuring 3.5 x 3 cm and one lesion possibly contiguous with the primary mass measuring 6.2 x 3.8 cm.  All of these had increased in size when compared to the previous examination.  There was a cystic lesion in the lateral right lobe of the liver with multiple septations and some calcifications within the septations.  This lesion was not significantly changed in size from the prior examination.  There was a soft tissue mass in the subcutaneous tissues of the anterior abdominal wall to the right of midline just below the umbilicus, not significantly changed from the prior.   2. Night sweats likely secondary to #1  Disposition: Mr. Rittenhouse appears to have multifocal hepatocellular carcinoma.  Dr. Benay Spice reviewed the diagnosis and potential treatment options with Mr. Zuckerman and his wife.  They  understand that surgical resection is most likely not an option.  His case will be presented at the upcoming GI tumor conference.  We are referring him for a staging CT scan of the chest and will obtain labs to include a CBC, chemistry panel, PT, AFP, hepatitis B and C serologies.  He will return for a follow-up visit on 05/11/2018 to review the outstanding data and establish a treatment plan.  For the night sweats he will try Aleve at bedtime.  Patient seen with Dr. Benay Spice.  CT images reviewed on the computer with Mr. Santilli and his wife.   Ned Card, NP 05/03/2018, 2:47 PM   This was a shared visit with Ned Card. Mr. Ortwein was interviewed and examined.  He has multifocal HCC.  We reviewed the 2017 CT images(waiting on access to recent [facility] images) and discussed treatment options.  I will present his case at the GI tumor conference next week and he will undergo additional staging evaluation.  He will most likely be treated with hepatic embolization or systemic therapy.  Julieanne Manson, MD

## 2018-05-03 NOTE — Progress Notes (Signed)
Met with patient and wife during initial consult with Ned Card NP and Dr. Benay Spice. I was able to speak with patient privately after appointment in lobby. The role of navigation discussed and contact information provided. CT scan scheduled for 05/10/18 @ 2:30 PM [facility] radiology. Patient aware to arrive at 2:15 PM. All questions answered to patient's satisfaction.

## 2018-05-04 ENCOUNTER — Ambulatory Visit
Admission: RE | Admit: 2018-05-04 | Discharge: 2018-05-04 | Disposition: A | Discharge status: Home / Self Care | Payer: Medicare Other | Source: Ambulatory Visit | Attending: Oncology | Admitting: Oncology

## 2018-05-04 ENCOUNTER — Other Ambulatory Visit: Payer: Self-pay | Admitting: Oncology

## 2018-05-04 ENCOUNTER — Telehealth: Payer: Self-pay

## 2018-05-04 DIAGNOSIS — C4490 Unspecified malignant neoplasm of skin, unspecified: Secondary | ICD-10-CM

## 2018-05-04 LAB — HEPATITIS B SURFACE ANTIGEN: HEP B S AG: NEGATIVE

## 2018-05-04 LAB — AFP TUMOR MARKER: AFP, Serum, Tumor Marker: 73.8 ng/mL — ABNORMAL HIGH (ref 0.0–8.3)

## 2018-05-04 LAB — HCV COMMENT:

## 2018-05-04 LAB — HEPATITIS B CORE ANTIBODY, TOTAL: Hep B Core Total Ab: NEGATIVE

## 2018-05-04 LAB — HEPATITIS C ANTIBODY (REFLEX)

## 2018-05-04 NOTE — Telephone Encounter (Signed)
Printed avs and calender of upcoming appointment per 8/1 los

## 2018-05-10 ENCOUNTER — Ambulatory Visit (HOSPITAL_COMMUNITY)
Admission: RE | Admit: 2018-05-10 | Discharge: 2018-05-10 | Disposition: A | Discharge status: Home / Self Care | Payer: Medicare Other | Source: Ambulatory Visit | Attending: Nurse Practitioner | Admitting: Nurse Practitioner

## 2018-05-10 ENCOUNTER — Encounter (HOSPITAL_COMMUNITY): Payer: Self-pay

## 2018-05-10 DIAGNOSIS — C220 Liver cell carcinoma: Secondary | ICD-10-CM | POA: Insufficient documentation

## 2018-05-10 DIAGNOSIS — R160 Hepatomegaly, not elsewhere classified: Secondary | ICD-10-CM | POA: Diagnosis not present

## 2018-05-10 DIAGNOSIS — I700 Atherosclerosis of aorta: Secondary | ICD-10-CM | POA: Insufficient documentation

## 2018-05-11 ENCOUNTER — Inpatient Hospital Stay: Payer: Medicare Other | Attending: Nurse Practitioner | Admitting: Nurse Practitioner

## 2018-05-11 ENCOUNTER — Telehealth: Payer: Self-pay | Admitting: Oncology

## 2018-05-11 VITALS — BP 129/56 | HR 73 | Temp 98.3°F | Resp 17 | Ht 70.0 in | Wt 170.6 lb

## 2018-05-11 DIAGNOSIS — Z87891 Personal history of nicotine dependence: Secondary | ICD-10-CM | POA: Diagnosis not present

## 2018-05-11 DIAGNOSIS — R61 Generalized hyperhidrosis: Secondary | ICD-10-CM | POA: Diagnosis not present

## 2018-05-11 DIAGNOSIS — C220 Liver cell carcinoma: Secondary | ICD-10-CM | POA: Diagnosis present

## 2018-05-11 NOTE — Telephone Encounter (Signed)
Appointments scheduled AVS/Calendar printed per 8/8 los °

## 2018-05-11 NOTE — Progress Notes (Signed)
[place] OFFICE PROGRESS NOTE   Diagnosis: Hepatocellular carcinoma  INTERVAL HISTORY:   Kenneth Woodward returns as scheduled.  No significant abdominal pain.  Appetite is unchanged.  He continues to work.  No recent sweats.  Objective:  Vital signs in last 24 hours:  Blood pressure (!) 129/56, pulse 73, temperature 98.3 F (36.8 C), temperature source Oral, resp. rate 17, height 5\' 10"  (1.778 m), weight 170 lb 9.6 oz (77.4 kg), SpO2 98 %.    Resp: Lungs clear bilaterally. Cardio: Regular rate and rhythm. GI: Liver palpable throughout the right upper abdomen.  Palpable mass upper mid abdomen. Vascular: Trace edema at the lower legs bilaterally. Neuro: Alert and oriented.   Lab Results:  Lab Results  Component Value Date   WBC 5.8 05/03/2018   HGB 11.1 (L) 05/03/2018   HCT 34.0 (L) 05/03/2018   MCV 86.6 05/03/2018   PLT 191 05/03/2018   NEUTROABS 3.8 05/03/2018    Imaging:  Ct Chest Wo Contrast  Result Date: 05/11/2018 CLINICAL DATA:  Newly diagnosed hepatocellular carcinoma, shortness of breath with exertion x2 years EXAM: CT CHEST WITHOUT CONTRAST TECHNIQUE: Multidetector CT imaging of the chest was performed following the standard protocol without IV contrast. COMPARISON:  Outside hospital CT abdomen/pelvis dated 04/04/2018 FINDINGS: Cardiovascular: Heart is normal in size.  No pericardial effusion. No evidence of thoracic aortic aneurysm. Atherosclerotic calcifications of the aortic arch. Three vessel coronary atherosclerosis. Mediastinum/Nodes: Small mediastinal lymph nodes which do not meet pathologic CT size criteria. Visualized thyroid is unremarkable. Lungs/Pleura: No suspicious pulmonary nodules. Calcified granuloma in the posterior right upper lobe (series 5/image 44), benign. No focal consolidation. Mild biapical pleural-parenchymal scarring. No pleural effusion or pneumothorax. Upper Abdomen: Visualized upper abdomen is notable for a nodular hepatic  contour with multifocal hepatic masses in the left hepatic lobe, including a dominant aggregate mass measuring approximately 18.0 x 11.3 cm in axial dimension, similar to the prior although poorly evaluated on unenhanced CT. Musculoskeletal: Degenerative changes of the visualized thoracolumbar spine. IMPRESSION: No evidence of metastatic disease in the chest. Multifocal hepatic masses, poorly evaluated on unenhanced CT but grossly unchanged. Aortic Atherosclerosis (ICD10-I70.0). Electronically Signed   By: Julian Hy M.D.   On: 05/11/2018 08:39    Medications: I have reviewed the patient's current medications.  Assessment/Plan: 1. Hepatocellular carcinoma  CT scan of the abdomen/pelvis 08/04/2016-multiple left hepatic lobe lesions.  The largest lesion measured 8.3 x 6.9 cm and was centered segment 4 of the liver.  Smaller lesion exophytic from the inferior anterior margin of the hepatic lobe measuring 3.5 x 3.2 cm.  Contiguous 3.6 cm exophytic lesion along the falciform ligament.  Cystic lesion in the right hepatic lobe without enhancement.  Additional cystic lesion in the central right hepatic lobe with enhancing septation measuring 19 mm.  Multiple additional cystic lesions noted. There was a soft tissue implant in the subcutaneous tissue of the right abdominal wall measuring 2.6 cm.    CT scan abdomen/pelvis at [facility] 04/04/2018-very large mass spanning the right and left lobes of the liver measuring approximately 15.2 x 10.7 x 14.8 cm.  Additional smaller masses noted adjacent to the primary lesion, one lateral to the lesion measuring 3.5 x 3 cm and one lesion possibly contiguous with the primary mass measuring 6.2 x 3.8 cm.  All of these had increased in size when compared to the previous examination.  There was a cystic lesion in the lateral right lobe of the liver with multiple septations and some  calcifications within the septations.  This lesion was not significantly changed in size from the  prior examination.  There was a soft tissue mass in the subcutaneous tissues of the anterior abdominal wall to the right of midline just below the umbilicus, not significantly changed from the prior.  CT-guided liver biopsy ([facility]) 04/27/2018-hepatocellular carcinoma  05/03/2018 AFP 73  Chest CT 05/10/2018- no evidence of metastatic disease in the chest.  Multifocal hepatic masses, grossly unchanged.   2. Night sweats likely secondary to #1  Disposition: Kenneth Woodward has multifocal hepatocellular carcinoma.   Dr. Benay Spice reviewed the diagnosis, prognosis and treatment options with Kenneth Woodward and his family.  They understand no therapy will be curative.  His case was presented at the recent GI tumor conference.  Surgery is not felt to be an option.  We discussed embolization as well as systemic treatment options.  Dr. Benay Spice recommends a referral to interventional radiology to consider embolization.  Kenneth Woodward agrees with this plan.  He and his family are also interested in a palliative care referral.  We will make a referral to the [facility] palliative care program.  We also made a referral to the [place] dietitian.  We reviewed the labs from 05/03/2018 as well as the chest CT from 05/10/2018.  CT images, including the chest CT and outside CT from 04/04/2018, were reviewed on the computer with Kenneth Woodward and his family.  He will return for a follow-up visit in approximately 6 weeks.  He and his family understand to contact the office in the interim with any problems.  Patient seen with Dr. Benay Spice.  45 minutes were spent face-to-face at today's visit with the majority of that time involved in counseling/coordination of care.     Ned Card ANP/GNP-BC   05/11/2018  2:44 PM  This was a shared visit with Ned Card.  Kenneth Woodward has been diagnosed with multifocal hepatocellular carcinoma.  He is not a surgical candidate.  His case was presented at the GI tumor conference on  05/10/2018.  The recommendation is to proceed with embolization therapy.  I discussed treatment options with Kenneth Woodward and multiple family members.  He understands no therapy will be curative.  We discussed observation/supportive care, hepatic directed therapy, and systemic therapy.  He states that he does wish to proceed with treatment.  He agrees to a consultation with interventional radiology to consider radioembolization treatment.  I discussed the general treatment plan for Y-90 therapy.  We also discussed the availability of systemic therapy with lenvatinib and immunotherapy if the tumor does not respond to embolization.  Kenneth Woodward requested a home palliative care evaluation.  He will return for an office visit here in 6 weeks.  Julieanne Manson, MD

## 2018-05-15 ENCOUNTER — Telehealth: Payer: Self-pay

## 2018-05-15 DIAGNOSIS — C220 Liver cell carcinoma: Secondary | ICD-10-CM

## 2018-05-15 NOTE — Telephone Encounter (Signed)
VM left for patients wife to schedule visit with Palliative Care. 

## 2018-05-15 NOTE — Telephone Encounter (Signed)
Spoke with patient's wife who felt that patient was not in need of palliative care at this time. Wife will contact Dr. Gearldine Shown office if [place] is needed in the future

## 2018-05-15 NOTE — Progress Notes (Signed)
Pt's wife called requesting a nutrition referral. Referral placed.

## 2018-05-16 ENCOUNTER — Ambulatory Visit
Admission: RE | Admit: 2018-05-16 | Discharge: 2018-05-16 | Disposition: A | Discharge status: Home / Self Care | Payer: Medicare Other | Source: Ambulatory Visit | Attending: Nurse Practitioner | Admitting: Nurse Practitioner

## 2018-05-16 DIAGNOSIS — C220 Liver cell carcinoma: Secondary | ICD-10-CM

## 2018-05-16 HISTORY — PX: IR RADIOLOGIST EVAL & MGMT: IMG5224

## 2018-05-16 NOTE — Consult Note (Signed)
Chief Complaint: [place] diagnosis.   Referring Physician(s): Chesley Noon, PCP [facility]  Oncology: Dr. Benay Spice Surgery: Dr. Leighton Ruff  History of Present Illness: Drystan Reader is a 84 y.o. male presenting as a scheduled consultation to Vascular & Interventional Radiology for evaluation of candidacy for liver-directed therapy of a newly diagnosed [place].    Mr Noseworthy is here today with his wife for the consultation.    He is a gentleman who is in excellent health, and does not take any medications.  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  He has been in practice for over 60 years.  He has no significant symptoms.   He was referred to our oncology team here at [facility] by his physicians at [facility], as I understand it, for geographic ease of access to an intensifying treatment schedule.  His [place] diagnosis was made on recent CT imaging and a percutaneous biopsy, performed in [facility].    CT imaging is available from 04/04/2018 and 05/05/2018.  There is additional CT imaging performed 08/04/2016.    His case was recently reviewed at the Gastro-enterology tumor conference, where input was solicited from GI, oncologic surgery, oncology, VIR, and all staff present.  He was determined not to be a surgical candidate.     I did ask him about the CT performed in 2017, that demonstrated the tumor.  He did not have a work-up at that time.    He continues to be very active, and in fact is a music lover, enjoying the harmonica.  He tells me he carries a harmonica with him everywhere.   His MELD is calculated 6, with estimated 3 month mortality of <2% His CP is calculated A, number 5, essentially normal.   His liver labs are essentially normal, with only slight elevation of the AST and ALT.   AFP is 74. Negative HCV and HBV studies.     No past medical history on file.  Past Surgical History:  Procedure Laterality Date  . CATARACT EXTRACTION    .  CERVICAL DISCECTOMY     anterior approach    Allergies: Adhesive [tape]  Medications: Prior to Admission medications   Not on File     No family history on file.  Social History   Socioeconomic History  . Marital status: Married    Spouse name: Not on file  . Number of children: Not on file  . Years of education: Not on file  . Highest education level: Not on file  Occupational History  . Not on file  Social Needs  . Financial resource strain: Not on file  . Food insecurity:    Worry: Not on file    Inability: Not on file  . Transportation needs:    Medical: Not on file    Non-medical: Not on file  Tobacco Use  . Smoking status: Former Smoker    Last attempt to quit: 10/04/1978    Years since quitting: 39.6  . Smokeless tobacco: Never Used  Substance and Sexual Activity  . Alcohol use: Yes    Alcohol/week: 3.0 standard drinks    Types: 3 Glasses of wine per week  . Drug use: No  . Sexual activity: Not on file  Lifestyle  . Physical activity:    Days per week: Not on file    Minutes per session: Not on file  . Stress: Not on file  Relationships  . Social connections:    Talks  on phone: Not on file    Gets together: Not on file    Attends religious service: Not on file    Active member of club or organization: Not on file    Attends meetings of clubs or organizations: Not on file    Relationship status: Not on file  Other Topics Concern  . Not on file  Social History Narrative  . Not on file    ECOG Status: 0 - Asymptomatic  Review of Systems: A 12 point ROS discussed and pertinent positives are indicated in the HPI above.  All other systems are negative.  Review of Systems  Vital Signs: BP 126/61   Pulse 62   Temp 97.9 F (36.6 C) (Oral)   Resp 15   Ht 5\' 10"  (1.778 m)   Wt 75.8 kg   SpO2 97%   BMI 23.96 kg/m   Physical Exam General: 84 yo male appearing younger than stated age.  Well-developed, well-nourished, well-kempt.  No  distress. HEENT: Atraumatic, normocephalic.  Conjugate gaze, extra-ocular motor intact. No scleral icterus or scleral injection. No lesions on external ears, nose, lips, or gums.  Oral mucosa moist, pink.  Neck: Symmetric with no goiter enlargement.  Chest/Lungs:  Symmetric chest with inspiration/expiration.  No labored breathing.  Clear to auscultation with no wheezes, rhonchi, or rales.  Heart:  RRR, with no third heart sounds appreciated. No JVD appreciated.  Abdomen:  Soft, NT/ND, with + bowel sounds.   Genito-urinary: Deferred Neurologic: Alert & Oriented to person, place, and time.   Normal affect and insight.  Appropriate questions.  Moving all 4 extremities with gross sensory intact.   Mallampati Score:  2  Imaging: Ct Chest Wo Contrast  Result Date: 05/11/2018 CLINICAL DATA:  Newly diagnosed hepatocellular carcinoma, shortness of breath with exertion x2 years EXAM: CT CHEST WITHOUT CONTRAST TECHNIQUE: Multidetector CT imaging of the chest was performed following the standard protocol without IV contrast. COMPARISON:  Outside hospital CT abdomen/pelvis dated 04/04/2018 FINDINGS: Cardiovascular: Heart is normal in size.  No pericardial effusion. No evidence of thoracic aortic aneurysm. Atherosclerotic calcifications of the aortic arch. Three vessel coronary atherosclerosis. Mediastinum/Nodes: Small mediastinal lymph nodes which do not meet pathologic CT size criteria. Visualized thyroid is unremarkable. Lungs/Pleura: No suspicious pulmonary nodules. Calcified granuloma in the posterior right upper lobe (series 5/image 44), benign. No focal consolidation. Mild biapical pleural-parenchymal scarring. No pleural effusion or pneumothorax. Upper Abdomen: Visualized upper abdomen is notable for a nodular hepatic contour with multifocal hepatic masses in the left hepatic lobe, including a dominant aggregate mass measuring approximately 18.0 x 11.3 cm in axial dimension, similar to the prior although  poorly evaluated on unenhanced CT. Musculoskeletal: Degenerative changes of the visualized thoracolumbar spine. IMPRESSION: No evidence of metastatic disease in the chest. Multifocal hepatic masses, poorly evaluated on unenhanced CT but grossly unchanged. Aortic Atherosclerosis (ICD10-I70.0). Electronically Signed   By: Julian Hy M.D.   On: 05/11/2018 08:39    Labs:  CBC: Recent Labs    05/03/18 1558  WBC 5.8  HGB 11.1*  HCT 34.0*  PLT 191    COAGS: Recent Labs    05/03/18 1558  INR 0.99    BMP: Recent Labs    05/03/18 1558  NA 140  K 4.2  CL 103  CO2 29  GLUCOSE 93  BUN 18  CALCIUM 9.4  CREATININE 0.89  GFRNONAA >60  GFRAA >60    LIVER FUNCTION TESTS: Recent Labs    05/03/18 1558  BILITOT 0.7  AST 91*  ALT 60*  ALKPHOS 131*  PROT 7.2  ALBUMIN 3.9    TUMOR MARKERS: No results for input(s): AFPTM, CEA, CA199, CHROMGRNA in the last 8760 hours.  Assessment and Plan:  Mr Veselka is a very pleasant 84 year old male with new diagnosis of [place], essentially normal liver lab values, and ECOG of 0.    His BCLC stage, despite a large tumor burden and multi-focality, is Intermediate stage, as his ECOG is 0 and there is no evidence of local nodal disease, no mets, and no portal vein invasion.    I had a lengthy discussion with Mr Saavedra and his wife regarding his imaging, broadly-available treatment options for [facility], and specifically treatment that VIR can offer.  I was very clear when I told him that surgical options are the only available cure, and they understand.    My impression is that with his excellent lab values and general absence of background liver disease, being aggressive with liver directed therapy is a reasonable approach, if that was what he wanted as an alternative to expectant/medical care.  He is interested in aggressive therapy plan.    Given the multi-focal lesions and the size, embolic treatment is indicated, and I think y90 therapy is  the most reasonable.  This will be the most well tolerated, will have the least amount of negative effect on his baseline liver function, with better toxicity profile than chemoembolization, and has been shown to be effective in improved time-to-progression.   I discussed the logistics of y90, which included a discussion about planning/mapping angiogram before therapy, a tentative plan for 1 to 2 treatment after mapping, and an overall treatment time-frame of about 6 weeks or so from mapping to the last planned treatment. After that, I did let them know that the next planned CT scan would be in about 3 months.   Specific risks discussed included: bleeding, infection, arterial problem, non-target embolization, abscess, GI ulceration, need for further surgery/procedure, hospitalization, liver failure, cardiopulmonary collapse, death.   After our discussion, he would like to proceed with y90 mapping and treatment with Dr. Earleen Newport.   Plan: - Plan for y90 mapping, to include possible embolization of non-target arteries and possible high flow shunts - Plan for 1 to 2 y90 therapy doses - I have encouraged him to observe his follow up with his other physicians.   Electronically Signed: Corrie Mckusick 05/16/2018, 4:45 PM   I spent a total of  40 Minutes   in face to face in clinical consultation, greater than 50% of which was counseling/coordinating care for new diagnosis of [place], possible angiogram, possible y90 therapy.

## 2018-05-17 ENCOUNTER — Encounter: Payer: Self-pay | Admitting: *Deleted

## 2018-05-23 DIAGNOSIS — C220 Liver cell carcinoma: Secondary | ICD-10-CM

## 2018-05-23 NOTE — Progress Notes (Signed)
Called pt's wife to discuss available supports for her as she processes pt's diagnosis. Referral to social work placed.

## 2018-05-24 ENCOUNTER — Other Ambulatory Visit (HOSPITAL_COMMUNITY): Payer: Self-pay | Admitting: Interventional Radiology

## 2018-05-24 DIAGNOSIS — C220 Liver cell carcinoma: Secondary | ICD-10-CM

## 2018-05-26 ENCOUNTER — Encounter: Payer: Self-pay | Admitting: General Practice

## 2018-05-26 NOTE — Progress Notes (Signed)
[place] CSW Progress Note  Met w wife Vaughan Basta for caregiver support.  Resources and encouragement provided.  Edwyna Shell, LCSW Clinical Social Worker Phone:  [phone]

## 2018-06-08 ENCOUNTER — Other Ambulatory Visit: Payer: Self-pay | Admitting: Student

## 2018-06-09 ENCOUNTER — Ambulatory Visit (HOSPITAL_COMMUNITY)
Admission: RE | Admit: 2018-06-09 | Discharge: 2018-06-09 | Disposition: A | Discharge status: Home / Self Care | Payer: Medicare Other | Source: Ambulatory Visit | Attending: Interventional Radiology | Admitting: Interventional Radiology

## 2018-06-09 ENCOUNTER — Other Ambulatory Visit (HOSPITAL_COMMUNITY): Payer: Self-pay | Admitting: Interventional Radiology

## 2018-06-09 ENCOUNTER — Other Ambulatory Visit: Payer: Self-pay

## 2018-06-09 ENCOUNTER — Encounter (HOSPITAL_COMMUNITY): Payer: Self-pay

## 2018-06-09 ENCOUNTER — Encounter (HOSPITAL_COMMUNITY)
Admission: RE | Admit: 2018-06-09 | Discharge: 2018-06-09 | Disposition: A | Discharge status: Home / Self Care | Payer: Medicare Other | Source: Ambulatory Visit | Attending: Interventional Radiology | Admitting: Interventional Radiology

## 2018-06-09 DIAGNOSIS — C220 Liver cell carcinoma: Secondary | ICD-10-CM

## 2018-06-09 HISTORY — PX: IR EMBO ARTERIAL NOT HEMORR HEMANG INC GUIDE ROADMAPPING: IMG5448

## 2018-06-09 HISTORY — PX: IR ANGIOGRAM SELECTIVE EACH ADDITIONAL VESSEL: IMG667

## 2018-06-09 HISTORY — PX: IR ANGIOGRAM VISCERAL SELECTIVE: IMG657

## 2018-06-09 HISTORY — PX: IR US GUIDE VASC ACCESS RIGHT: IMG2390

## 2018-06-09 HISTORY — PX: IR EMBO TUMOR ORGAN ISCHEMIA INFARCT INC GUIDE ROADMAPPING: IMG5449

## 2018-06-09 LAB — COMPREHENSIVE METABOLIC PANEL
ALBUMIN: 3.9 g/dL (ref 3.5–5.0)
ALK PHOS: 126 U/L (ref 38–126)
ALT: 42 U/L (ref 0–44)
ANION GAP: 13 (ref 5–15)
AST: 79 U/L — ABNORMAL HIGH (ref 15–41)
BILIRUBIN TOTAL: 0.7 mg/dL (ref 0.3–1.2)
BUN: 20 mg/dL (ref 8–23)
CALCIUM: 9.5 mg/dL (ref 8.9–10.3)
CO2: 26 mmol/L (ref 22–32)
CREATININE: 0.83 mg/dL (ref 0.61–1.24)
Chloride: 105 mmol/L (ref 98–111)
GFR calc Af Amer: >60 mL/min (ref >60)
GFR calc non Af Amer: >60 mL/min (ref >60)
GLUCOSE: 89 mg/dL (ref 70–99)
Potassium: 4.5 mmol/L (ref 3.5–5.1)
SODIUM: 144 mmol/L (ref 135–145)
TOTAL PROTEIN: 7.3 g/dL (ref 6.5–8.1)

## 2018-06-09 LAB — CBC WITH DIFFERENTIAL/PLATELET
BASOS PCT: 1 %
Basophils Absolute: 0 10*3/uL (ref 0.0–0.1)
Eosinophils Absolute: 0.1 10*3/uL (ref 0.0–0.7)
Eosinophils Relative: 2 %
HEMATOCRIT: 36 % — AB (ref 39.0–52.0)
HEMOGLOBIN: 11.5 g/dL — AB (ref 13.0–17.0)
LYMPHS ABS: 1.6 10*3/uL (ref 0.7–4.0)
Lymphocytes Relative: 29 %
MCH: 28.3 pg (ref 26.0–34.0)
MCHC: 31.9 g/dL (ref 30.0–36.0)
MCV: 88.5 fL (ref 78.0–100.0)
MONOS PCT: 9 %
Monocytes Absolute: 0.5 10*3/uL (ref 0.1–1.0)
NEUTROS PCT: 61 %
Neutro Abs: 3.4 10*3/uL (ref 1.7–7.7)
Platelets: 244 10*3/uL (ref 150–400)
RBC: 4.07 MIL/uL — AB (ref 4.22–5.81)
RDW: 14.4 % (ref 11.5–15.5)
WBC: 5.6 10*3/uL (ref 4.0–10.5)

## 2018-06-09 LAB — PROTIME-INR
INR: 0.91
PROTHROMBIN TIME: 12.1 s (ref 11.4–15.2)

## 2018-06-09 LAB — APTT: APTT: 30 s (ref 24–36)

## 2018-06-09 MED ORDER — FENTANYL CITRATE (PF) 100 MCG/2ML IJ SOLN
INTRAMUSCULAR | Status: AC | PRN
  Administered 2018-06-09: 25 ug via INTRAVENOUS
  Administered 2018-06-09: 50 ug via INTRAVENOUS
  Administered 2018-06-09: 25 ug via INTRAVENOUS

## 2018-06-09 MED ORDER — LIDOCAINE HCL 1 % IJ SOLN
INTRAMUSCULAR | Status: AC
  Filled 2018-06-09: qty 20

## 2018-06-09 MED ORDER — LIDOCAINE HCL 1 % IJ SOLN
INTRAMUSCULAR | Status: AC | PRN
  Administered 2018-06-09: 5 mL

## 2018-06-09 MED ORDER — FENTANYL CITRATE (PF) 100 MCG/2ML IJ SOLN
INTRAMUSCULAR | Status: AC
  Filled 2018-06-09: qty 2

## 2018-06-09 MED ORDER — IOHEXOL 300 MG/ML  SOLN
100.0000 mL | Freq: Once | INTRAMUSCULAR | Status: AC | PRN
  Administered 2018-06-09: 100 mL via INTRA_ARTERIAL

## 2018-06-09 MED ORDER — TECHNETIUM TO 99M ALBUMIN AGGREGATED
5.1000 | Freq: Once | INTRAVENOUS | Status: AC | PRN
  Administered 2018-06-09: 5.1 via INTRAVENOUS

## 2018-06-09 MED ORDER — NALOXONE HCL 0.4 MG/ML IJ SOLN
INTRAMUSCULAR | Status: AC
  Filled 2018-06-09: qty 1

## 2018-06-09 MED ORDER — SODIUM CHLORIDE 0.9 % IV SOLN
INTRAVENOUS | Status: DC
  Administered 2018-06-09: 09:00:00 via INTRAVENOUS

## 2018-06-09 MED ORDER — IOHEXOL 300 MG/ML  SOLN
100.0000 mL | Freq: Once | INTRAMUSCULAR | Status: AC | PRN
  Administered 2018-06-09: 7 mL via INTRA_ARTERIAL

## 2018-06-09 MED ORDER — FLUMAZENIL 0.5 MG/5ML IV SOLN
INTRAVENOUS | Status: AC
  Filled 2018-06-09: qty 5

## 2018-06-09 MED ORDER — SODIUM CHLORIDE 0.9 % IV SOLN: INTRAVENOUS | Status: DC

## 2018-06-09 MED ORDER — MIDAZOLAM HCL 2 MG/2ML IJ SOLN
INTRAMUSCULAR | Status: AC
  Filled 2018-06-09: qty 4

## 2018-06-09 MED ORDER — IOPAMIDOL (ISOVUE-300) INJECTION 61%: 100.0000 mL | Freq: Once | INTRAVENOUS | Status: DC | PRN

## 2018-06-09 MED ORDER — IOHEXOL 300 MG/ML  SOLN
100.0000 mL | Freq: Once | INTRAMUSCULAR | Status: AC | PRN
  Administered 2018-06-09: 10 mL via INTRA_ARTERIAL

## 2018-06-09 MED ORDER — IOHEXOL 300 MG/ML  SOLN
100.0000 mL | Freq: Once | INTRAMUSCULAR | Status: AC | PRN
  Administered 2018-06-09: 70 mL via INTRA_ARTERIAL

## 2018-06-09 MED ORDER — SODIUM CHLORIDE 0.9 % IV SOLN
INTRAVENOUS | Status: AC | PRN
  Administered 2018-06-09: 150 mL/h via INTRAVENOUS

## 2018-06-09 MED ORDER — MIDAZOLAM HCL 2 MG/2ML IJ SOLN
INTRAMUSCULAR | Status: AC | PRN
  Administered 2018-06-09: 0.5 mg via INTRAVENOUS
  Administered 2018-06-09 (×2): 1 mg via INTRAVENOUS
  Administered 2018-06-09 (×3): 0.5 mg via INTRAVENOUS

## 2018-06-09 NOTE — Sedation Documentation (Signed)
Patient is resting comfortably with eyes closed in NAD. 

## 2018-06-09 NOTE — Procedures (Signed)
Interventional Radiology Procedure Note  Procedure:  PSU-G64 mapping angiogram: US guided right CFA access, with Exoseal closure at completion Mesenteric angiogram Superselective Tumor embolization of the right hepatic artery segmental branches and the left hepatic artery, to decrease tumor shunting as a possible source for increased lung shunt fraction.  Performed with embospheres Coil embolization of right gastric artery.  Deposit MAA dose into the [facility]   Complications: None  Recs: - right leg straight x 4 hours - stable now to NM for MAA scan - gentle IV hydration - Advance diet as tolerated - Do not submerge for 3-4 days - Routine wound care - DC home in 4 hours when goals met - first dose scheduled for 9/19, left  Signed,  Dulcy Fanny. Earleen Newport, DO

## 2018-06-09 NOTE — Sedation Documentation (Signed)
Patient is resting comfortably with eyes closed and snoring lightly in NAD.

## 2018-06-09 NOTE — Sedation Documentation (Signed)
Patient snoring lightly.

## 2018-06-09 NOTE — H&P (Signed)
Referring Physician(s): Sherrill,B/Badger,M  Supervising Physician: Corrie Mckusick  Patient Status:  [facility]  Chief Complaint:  Multifocal hepatocellular carcinoma  Subjective: Patient familiar to IR service from recent consultation with Dr. Earleen Newport on 05/16/2018 to discuss treatment options for multifocal hepatocellular carcinoma.  He was deemed an appropriate candidate for Y 90 hepatic radioembolization and presents today for arterial roadmapping/embolization/test Y 90 dosing.  He currently denies fever, headache, chest pain, dyspnea, cough, significant abdominal/back pain, nausea, vomiting or bleeding.  History reviewed. No pertinent past medical history. Past Surgical History:  Procedure Laterality Date  . CATARACT EXTRACTION    . CERVICAL DISCECTOMY     anterior approach  . IR RADIOLOGIST EVAL & MGMT  05/16/2018     Allergies: Adhesive [tape]  Medications: Prior to Admission medications   Not on File     Vital Signs: BP (!) 142/59 (BP Location: Left Arm)   Pulse 69   Temp 97.8 F (36.6 C) (Oral)   Resp 16   Ht 5' 10.5" (1.791 m)   Wt 166 lb (75.3 kg)   SpO2 98%   BMI 23.48 kg/m   Physical Exam awake, alert.  Chest with distant breath sounds bilaterally.  Heart with regular rate and rhythm.  Abdomen soft, positive bowel sounds, nontender, palpable liver nodularity/subcutaneous abdominal masses; trace pretibial edema bilaterally.  Imaging: No results found.  Labs:  CBC: Recent Labs    05/03/18 1558  WBC 5.8  HGB 11.1*  HCT 34.0*  PLT 191    COAGS: Recent Labs    05/03/18 1558  INR 0.99    BMP: Recent Labs    05/03/18 1558  NA 140  K 4.2  CL 103  CO2 29  GLUCOSE 93  BUN 18  CALCIUM 9.4  CREATININE 0.89  GFRNONAA >60  GFRAA >60    LIVER FUNCTION TESTS: Recent Labs    05/03/18 1558  BILITOT 0.7  AST 91*  ALT 60*  ALKPHOS 131*  PROT 7.2  ALBUMIN 3.9    Assessment and Plan: Patient with history of multifocal  hepatocellular carcinoma, status post recent consultation with Dr. Earleen Newport on 05/16/2018 to discuss treatment options.  He was deemed an appropriate candidate for Y 90 hepatic radioembolization and presents today for the procedure.Risks and benefits of procedure were discussed with the patient/spouse including, but not limited to bleeding, infection, vascular injury or contrast induced renal failure.  This interventional procedure involves the use of X-rays and because of the nature of the planned procedure, it is possible that we will have prolonged use of X-ray fluoroscopy.  Potential radiation risks to you include (but are not limited to) the following: - A slightly elevated risk for cancer  several years later in life. This risk is typically less than 0.5% percent. This risk is low in comparison to the normal incidence of human cancer, which is 33% for women and 50% for men according to the [place]. - Radiation induced injury can include skin redness, resembling a rash, tissue breakdown / ulcers and hair loss (which can be temporary or permanent).   The likelihood of either of these occurring depends on the difficulty of the procedure and whether you are sensitive to radiation due to previous procedures, disease, or genetic conditions.   IF your procedure requires a prolonged use of radiation, you will be notified and given written instructions for further action.  It is your responsibility to monitor the irradiated area for the 2 weeks following the procedure and  to notify your physician if you are concerned that you have suffered a radiation induced injury.    All of the patient's questions were answered, patient is agreeable to proceed.  Consent signed and in chart.  LABS PENDING    Electronically Signed: D. Rowe Robert, PA-C 06/09/2018, 8:33 AM   I spent a total of 25 minutes at the the patient's bedside AND on the patient's hospital floor or unit, greater than 50% of  which was counseling/coordinating care for hepatic/visceral arteriogram with embolization and test Y 90 dosing

## 2018-06-09 NOTE — Discharge Instructions (Addendum)
Moderate Conscious Sedation, Adult, Care After These instructions provide you with information about caring for yourself after your procedure. Your health care provider may also give you more specific instructions. Your treatment has been planned according to current medical practices, but problems sometimes occur. Call your health care provider if you have any problems or questions after your procedure. What can I expect after the procedure? After your procedure, it is common:  To feel sleepy for several hours.  To feel clumsy and have poor balance for several hours.  To have poor judgment for several hours.  To vomit if you eat too soon.  Follow these instructions at home: For at least 24 hours after the procedure:   Do not: ? Participate in activities where you could fall or become injured. ? Drive. ? Use heavy machinery. ? Drink alcohol. ? Take sleeping pills or medicines that cause drowsiness. ? Make important decisions or sign legal documents. ? Take care of children on your own.  Rest. Eating and drinking  Follow the diet recommended by your health care provider.  If you vomit: ? Drink water, juice, or soup when you can drink without vomiting. ? Make sure you have little or no nausea before eating solid foods. General instructions  Have a responsible adult stay with you until you are awake and alert.  Take over-the-counter and prescription medicines only as told by your health care provider.  If you smoke, do not smoke without supervision.  Keep all follow-up visits as told by your health care provider. This is important. Contact a health care provider if:  You keep feeling nauseous or you keep vomiting.  You feel light-headed.  You develop a rash.  You have a fever. Get help right away if:  You have trouble breathing. This information is not intended to replace advice given to you by your health care provider. Make sure you discuss any questions you have  with your health care provider. Document Released: 07/11/2013 Document Revised: 02/23/2016 Document Reviewed: 01/10/2016 Elsevier Interactive Patient Education  2018 [employer].   Hepatic Artery Radioembolization Hepatic artery radioembolization is a combination of radiation therapy and a procedure to block blood supply to a tumor in the liver (embolization). It delivers tiny glass or resin beads (microspheres) into the blood vessel (hepatic artery) that supplies blood to the tumor. The microspheres lodge at the tumor site and deliver a high dose of radiation to the tumor but not to healthy tissue. The goal of this procedure is to slow or stop the growth of a cancerous tumor. You may need radioembolization if you have liver cancer or another type of cancer that has spread to the liver. After this procedure, the microspheres will release radiation into the tumor for about 2 weeks. Radiation will be completely gone after 30 days. If your tumor continues to grow or it returns after treatment, the procedure can be repeated. Tell a health care provider about:  Any allergies you have.  All medicines you are taking, including vitamins, herbs, eye drops, creams, and over-the-counter medicines.  Any problems you or family members have had with anesthetic medicines.  Any blood disorders you have.  Any surgeries you have had.  Any medical conditions you have.  Whether you are pregnant or may be pregnant. What are the risks? Generally, this is a safe procedure. However, problems may occur, including:  Infection.  Bleeding.  Allergic reactions to medicines or dyes.  Blood clots.  Stomach ulcer (if some microspheres travel to  the stomach).  There is also a risk of damage to other structures or organs, such as:  The affected blood vessel.  Healthy cells that are close to the radioembolization site.  The liver.  What happens before the procedure? Medicines Ask your health care  provider about:  Changing or stopping your regular medicines. This is especially important if you are taking diabetes medicines or blood thinners.  Taking medicines such as aspirin and ibuprofen. These medicines can thin your blood. Do not take these medicines before your procedure if your health care provider instructs you not to.  Staying hydrated Follow instructions from your health care provider about hydration, which may include:  Up to 2 hours before the procedure - you may continue to drink clear liquids, such as water, clear fruit juice, black coffee, and plain tea.  Eating and drinking restrictions Follow instructions from your health care provider about eating and drinking, which may include:  8 hours before the procedure - stop eating heavy meals or foods such as meat, fried foods, or fatty foods.  6 hours before the procedure - stop eating light meals or foods, such as toast or cereal.  6 hours before the procedure - stop drinking milk or drinks that contain milk.  2 hours before the procedure - stop drinking clear liquids.  General instructions  You may have blood tests.  You may have a urine sample taken.  You will have an X-ray of the blood vessels in your liver (angiogram).  Ask your health care provider how your surgical site will be marked or identified.  To reduce your risk of infection: ? You may be given antibiotic medicine to help prevent infection. ? You may be asked to shower with a germ-killing soap.  Plan to have someone take you home from the hospital or clinic.  If you will be going home right after the procedure, plan to have someone with you for 24 hours. What happens during the procedure?  To reduce your risk of infection: ? Your health care team will wash or sanitize their hands. ? Your skin will be washed with soap.  An IV tube will be inserted into one of your veins.  You will be given one or more of the following: ? A medicine to help  you relax (sedative). ? A medicine to numb your groin area (local anesthetic). ? A medicine to make you fall asleep (general anesthetic).  A needle will be inserted into a large blood vessel in your groin (femoral artery).  A thin tube (catheter) will be passed through the needle and into your femoral artery. The catheter will be guided to the hepatic artery.  Dye will be injected through the IV tube, and X-rays will be taken (fluoroscopy). This helps to show the exact location of the blood vessels that supply your tumor. Your surgeon will use the X-rays as a guide during the procedure.  Microspheres will be injected through the catheter into the parts of the hepatic artery that supply the tumor.  More X-rays will be taken to make sure the blood supply to the tumor has been blocked as planned.  The catheter will be removed, and pressure will be applied to the area to stop any bleeding.  A bandage (dressing) will be applied. The procedure may vary among health care providers and hospitals. What happens after the procedure?  Your blood pressure, heart rate, breathing rate, and blood oxygen level will be monitored until the medicines you were given  have worn off.  You may have to wear compression stockings. These stockings help to prevent blood clots and reduce swelling in your legs.  You may continue to receive fluids and medicines through an IV tube.  You may have pain, nausea, vomiting, or a fever. These symptoms are called post-embolization syndrome. You may be given medicine to help relieve these symptoms.  You will need to stay lying down for 6-8 hours.  You may need to avoid contact with people during your hospital stay. You may also need to avoid contact for a certain amount of time after you leave the hospital. Ask your health care provider if this applies to you.  Do not drive for 24 hours if you were given a sedative. This information is not intended to replace advice given  to you by your health care provider. Make sure you discuss any questions you have with your health care provider. Document Released: 09/25/2013 Document Revised: 06/19/2016 Document Reviewed: 06/19/2016 Elsevier Interactive Patient Education  2018 [employer].   Hepatic Artery Radioembolization, Care After This sheet gives you information about how to care for yourself after your procedure. Your health care provider may also give you more specific instructions. If you have problems or questions, contact your health care provider. What can I expect after the procedure? After the procedure, it is common to have:  A slight fever for 1-2 weeks. If your fever gets worse, tell your health care provider.  Fatigue.  Loss of appetite. This should gradually improve after about 1 week.  Abdominal pain on your right side.  Soreness and tenderness in your groin area where the needle and catheter were placed (puncture site).  Follow these instructions at home: Puncture site care  Follow instructions from your health care provider about how to take care of the puncture site. Make sure you: ? Wash your hands with soap and water before you change your bandage (dressing). If soap and water are not available, use hand sanitizer. ? Change your dressing as told by your health care provider.  You may remove your dressing tomorrow. ? Leave stitches (sutures), skin glue, or adhesive strips in place. These skin closures may need to stay in place for 2 weeks or longer. If adhesive strip edges start to loosen and curl up, you may trim the loose edges. Do not remove adhesive strips completely unless your health care provider tells you to do that.  Check your puncture site every day for signs of infection. Check for: ? More redness, swelling, or pain. ? More fluid or blood. ? Warmth. ? Pus or a bad smell. Activity  Rest and return to your normal activities as told by your health care provider. Ask your  health care provider what activities are safe for you.  Do not drive for 24 hours after the procedure if you were given a medicine to help you relax (sedative).  Do not lift anything that is heavier than 10 lb (4.5 kg) until your health care provider says that it is safe. Medicines  Take over-the-counter and prescription medicines only as told by your health care provider.  Do not drive or use heavy machinery while taking prescription pain medicine. Radiation precautions  For up to a week after your procedure, there will be a small amount of radioactivity near your liver. This is not especially dangerous to other people. However, you should follow these precautions for 7 days: ? Do not come in close contact with people. ? Do not sleep  in the same bed as someone else. ? Do not hold children or babies. ? Do not have contact with pregnant women. General instructions   To prevent or treat constipation while you are taking prescription pain medicine, your health care provider may recommend that you: ? Drink enough fluid to keep your urine clear or pale yellow. ? Take over-the-counter or prescription medicines. ? Eat foods that are high in fiber, such as fresh fruits and vegetables, whole grains, and beans. ? Limit foods that are high in fat and processed sugars, such as fried and sweet foods.  Eat frequent small meals until your appetite returns. Follow instructions from your health care provider about eating or drinking restrictions.  Do not take baths, swim, or use a hot tub until your health care provider approves. You may take showers. Wash your puncture site with mild soap and water and pat the area dry.  You may shower tomorrow.  Wear compression stockings as told by your health care provider. These stockings help to prevent blood clots and reduce swelling in your legs.  Keep all follow-up visits as told by your health care provider. This is important. You may need to have blood  tests and imaging tests done. Contact a health care provider if:  You have more redness, swelling, or pain around your puncture site.  You have more fluid or blood coming from your puncture site.  Your puncture site feels warm to the touch.  You have pus or a bad smell coming from your puncture site.  You have pain that: ? Gets worse. ? Does not get better with medicine. ? Feels like very bad heartburn. ? Is in the middle of your abdomen, above your belly button.  Your skin and the white parts of your eyes turn yellow (jaundice).  The color of your urine changes to dark brown.  The color of your stool changes to light yellow.  Your abdominal measurement (girth) increases in a short period of time.  You gain more than 5 lb (2.3 kg) in a short period of time. Get help right away if:  You have a fever that lasts longer than 2 weeks or is higher than what your health care provider told you to expect.  You develop any of the following in your legs: ? Pain. ? Swelling. ? Skin that is cold or pale or turns blue.  You have chest pain.  You have blood in your vomit, saliva, or stool.  You have trouble breathing. This information is not intended to replace advice given to you by your health care provider. Make sure you discuss any questions you have with your health care provider. Document Released: 09/25/2013 Document Revised: 06/19/2016 Document Reviewed: 06/19/2016 Elsevier Interactive Patient Education  2018 [place] Radioembolization Discharge Instructions  You have been given a radioactive material during your procedure.  While it is safe for you to be discharged home from the hospital, you need to proceed directly home.    Do not use public transportation, including air travel, lasting more than 2 hours for 1 week.  Avoid crowded public places for 1 week.  Adult visitors should try to avoid close contact with you for 1 week.    Children and  pregnant females should not visit or have close contact with you for 1 week.  Items that you touch are not radioactive.  Do not sleep in the same bed as your partner for 1 week, and a condom  should be used for sexual activity during the first 24 hours.  Your blood may be radioactive and caution should be used if any bleeding occurs during the recovery period.  Body fluids may be radioactive for 24 hours.  Wash your hands after voiding.  Men should sit to urinate.  Dispose of any soiled materials (flush down toilet or place in trash at home) during the first day.  Drink 6 to 8 glasses of fluids per day for 5 days to hydrate yourself.  If you need to see a doctor during the first week, you must let them know that you were treated with yttrium-90 microspheres, and will be slightly radioactive.  They can call Interventional Radiology [phone] with any questions.

## 2018-06-13 ENCOUNTER — Ambulatory Visit
Admission: RE | Admit: 2018-06-13 | Discharge: 2018-06-13 | Disposition: A | Discharge status: Home / Self Care | Payer: Medicare Other | Source: Ambulatory Visit | Attending: Interventional Radiology | Admitting: Interventional Radiology

## 2018-06-13 ENCOUNTER — Other Ambulatory Visit: Payer: Self-pay | Admitting: Interventional Radiology

## 2018-06-13 ENCOUNTER — Encounter: Payer: Self-pay | Admitting: *Deleted

## 2018-06-13 DIAGNOSIS — C220 Liver cell carcinoma: Secondary | ICD-10-CM

## 2018-06-13 HISTORY — PX: IR RADIOLOGIST EVAL & MGMT: IMG5224

## 2018-06-13 NOTE — Progress Notes (Signed)
  Oncology Nurse Navigator Documentation  Navigator Location: [facility] (06/13/18 2595)   )Navigator Encounter Type: Telephone (06/13/18 0846) Telephone: Incoming Call;Symptom Mgt (06/13/18 6387)     Mrs. Shenker called to say that after patient had his mapping done IN IR he has been weak and not able to eat. Both patient and wife were on speaker phone. Patient said that if he could just eat he would feel better. They are meeting with an IR MD this morning. They understand that they can call with questions or concerns.                 Barriers/Navigation Needs: Education (06/13/18 0846) Education: Pain/ Symptom Management (06/13/18 0846) Interventions: Education;Psycho-social support (06/13/18 0846)            Acuity: Level 2 (06/13/18 0846)         Time Spent with Patient: 15 (06/13/18 0846)

## 2018-06-13 NOTE — Progress Notes (Addendum)
Patient ID: Kenneth Woodward, male   DOB: 15-Aug-1928, 84 y.o.   MRN: [id number]       Chief Complaint: Patient was seen in consultation today for  Chief Complaint  Patient presents with  . Follow-up   at the request of [place]  Referring Physician(s): Wagner,Jaime  History of Present Illness: Kenneth Woodward is a 84 y.o. male who presents for an unscheduled appointment 4 days post right hepatic arteriogram with microsphere embolization of tumor vascular shunts, GDA coil embolization, and MAA intra-arterial test dose administration.  He has hepatocellular carcinoma.  Patient describes significant fatigue, weakness, and  anorexia post procedure.  He  had not been having the symptoms before the procedure.  No abdominal pain.  His spouse suspects some decrease in concentration ability.  He has been more somnolent.  No vomiting.  No abdominal pain. No past medical history on file.  Past Surgical History:  Procedure Laterality Date  . CATARACT EXTRACTION    . CERVICAL DISCECTOMY     anterior approach  . IR ANGIOGRAM SELECTIVE EACH ADDITIONAL VESSEL  06/09/2018  . IR ANGIOGRAM SELECTIVE EACH ADDITIONAL VESSEL  06/09/2018  . IR ANGIOGRAM SELECTIVE EACH ADDITIONAL VESSEL  06/09/2018  . IR ANGIOGRAM SELECTIVE EACH ADDITIONAL VESSEL  06/09/2018  . IR ANGIOGRAM SELECTIVE EACH ADDITIONAL VESSEL  06/09/2018  . IR ANGIOGRAM SELECTIVE EACH ADDITIONAL VESSEL  06/09/2018  . IR ANGIOGRAM SELECTIVE EACH ADDITIONAL VESSEL  06/09/2018  . IR ANGIOGRAM SELECTIVE EACH ADDITIONAL VESSEL  06/09/2018  . IR ANGIOGRAM VISCERAL SELECTIVE  06/09/2018  . IR ANGIOGRAM VISCERAL SELECTIVE  06/09/2018  . IR EMBO ARTERIAL NOT HEMORR HEMANG INC GUIDE ROADMAPPING  06/09/2018  . IR EMBO TUMOR ORGAN ISCHEMIA INFARCT INC GUIDE ROADMAPPING  06/09/2018  . IR RADIOLOGIST EVAL & MGMT  05/16/2018  . IR US GUIDE VASC ACCESS RIGHT  06/09/2018    Allergies: Adhesive [tape]  Medications: Prior to Admission medications   Not on File     No  family history on file.  Social History   Socioeconomic History  . Marital status: Married    Spouse name: Not on file  . Number of children: Not on file  . Years of education: Not on file  . Highest education level: Not on file  Occupational History  . Not on file  Social Needs  . Financial resource strain: Not on file  . Food insecurity:    Worry: Not on file    Inability: Not on file  . Transportation needs:    Medical: Not on file    Non-medical: Not on file  Tobacco Use  . Smoking status: Former Smoker    Last attempt to quit: 10/04/1978    Years since quitting: 39.7  . Smokeless tobacco: Never Used  Substance and Sexual Activity  . Alcohol use: Yes    Alcohol/week: 3.0 standard drinks    Types: 3 Glasses of wine per week  . Drug use: No  . Sexual activity: Not on file  Lifestyle  . Physical activity:    Days per week: Not on file    Minutes per session: Not on file  . Stress: Not on file  Relationships  . Social connections:    Talks on phone: Not on file    Gets together: Not on file    Attends religious service: Not on file    Active member of club or organization: Not on file    Attends meetings of clubs or organizations: Not on file  Relationship status: Not on file  Other Topics Concern  . Not on file  Social History Narrative  . Not on file    ECOG Status: 2 - Symptomatic, <50% confined to bed  Review of Systems: A 12 point ROS discussed and pertinent positives are indicated in the HPI above.  All other systems are negative.  Review of Systems   Physical Exam Constitutional: Oriented to person, place, and time. Well-developed and well-nourished. No distress.  Last Weight  Most recent update: 06/13/2018 10:44 AM   Weight  74.8 kg (165 lb)            Vital Signs: BP (!) 121/50   Pulse 73   Temp 97.9 F (36.6 C) (Oral)   Resp 16   Ht 5' 10.5" (1.791 m)   Wt 74.8 kg   SpO2 98%   BMI 23.34 kg/m   HENT:  Head: Normocephalic and  atraumatic.  Eyes: Conjunctivae and EOM are normal. Right eye exhibits no discharge. Left eye exhibits no discharge. No scleral icterus.  Neck: No JVD present.  Pulmonary/Chest: Effort normal. No stridor. No respiratory distress.  Abdomen: soft, non distended, nontender Neurological:  alert and oriented to person, place, and time.  Skin: Skin is warm and dry.  not diaphoretic.  Psychiatric:   normal mood and affect.   behavior is normal. Judgment and thought content normal.     Imaging: Nm Liver Img Spect  Result Date: 06/09/2018 CLINICAL DATA:  Hepatocellular carcinoma. EXAM: NUCLEAR MEDICINE LIVER SCAN TECHNIQUE: Abdominal images were obtained in multiple projections after intrahepatic arterial injection of radiopharmaceutical. SPECT imaging was performed. Lung shunt calculation was performed. RADIOPHARMACEUTICALS:  5.72millicurie MAA TECHNETIUM TO 65M ALBUMIN AGGREGATED COMPARISON:  Angiogram 06/09/2018, CT a abdomen 08/04/2016, CT abdomen 04/04/2018 FINDINGS: The injected microaggregated albumin localizes within the liver. No evidence of activity within the stomach, duodenum, or bowel. Majority activity is peripheral around the tumor. Calculated shunt fraction to the lungs equals 37%. IMPRESSION: 1. No significant extrahepatic radiotracer activity following intrahepatic arterial injection of MAA. 2. Lung shunt fraction equals 37% Findings conveyed toJAIME WAGNER on 06/09/2018  at13:27. Electronically Signed   By: Suzy Bouchard M.D.   On: 06/09/2018 13:27   Ir Angiogram Visceral Selective  Result Date: 06/09/2018 INDICATION: 84 year old male with a history of hepatocellular carcinoma, presenting today for pre Y 90 mapping EXAM: ULTRASOUND GUIDED ACCESS RIGHT COMMON FEMORAL ARTERY MESENTERIC ANGIOGRAM COIL EMBOLIZATION OF RIGHT GASTRIC ARTERY AS A POTENTIAL SOURCE FOR NON TARGET EMBOLIZATION SUPER SELECTIVE TUMOR EMBOLIZATION TO TREAT VASCULAR SHUNTS WHICH COULD CONTRIBUTE TO INCREASED HEPATIC OF  PULMONARY SHUNTING DEPLOYMENT OF EXOSEAL FOR HEMOSTASIS MEDICATIONS: None ANESTHESIA/SEDATION: Moderate (conscious) sedation was employed during this procedure. A total of Versed 4.0 mg and Fentanyl 100 mcg was administered intravenously. Moderate Sedation Time: 106 minutes. The patient's level of consciousness and vital signs were monitored continuously by radiology nursing throughout the procedure under my direct supervision. CONTRAST:  187 cc Omnipaque 300 FLUOROSCOPY TIME:  Fluoroscopy Time: 22 minutes 12 seconds (3683 mGy). COMPLICATIONS: None PROCEDURE: Informed consent was obtained from the patient following explanation of the procedure, risks, benefits and alternatives. The patient understands, agrees and consents for the procedure. All questions were addressed. A time out was performed prior to the initiation of the procedure. Maximal barrier sterile technique utilized including caps, mask, sterile gowns, sterile gloves, large sterile drape, hand hygiene, and Betadine prep. Ultrasound survey of the right inguinal region was performed with images stored and sent to PACs, confirming patency  of the vessel. A micropuncture needle was used access the right common femoral artery under ultrasound. With excellent arterial blood flow returned, and an .018 micro wire was passed through the needle, observed enter the abdominal aorta under fluoroscopy. The needle was removed, and a micropuncture sheath was placed over the wire. The inner dilator and wire were removed, and an 035 Bentson wire was advanced under fluoroscopy into the abdominal aorta. The sheath was removed and a standard 5 [place] vascular sheath was placed. The dilator was removed and the sheath was flushed. Cobra catheter was advanced over the Bentson wire to the abdominal aorta. Catheter was used to select the celiac artery. Angiogram was performed. Catheter was withdrawn to the superior mesenteric artery origin. Angiogram was performed. Catheter was  then used to select the celiac artery, and a Glidewire was advanced in attempt to place the base catheter within the common hepatic artery. Given the angle of the celiac artery trunk, this was not possible. The Cobra catheter was then exchanged over a standard Bentson wire for a Mickelson catheter. With the [facility] catheter at the celiac artery origin, microcatheter system was advanced. Initially we used a 150 cm high-flow Renegade catheter and a fathom wire. Microcatheter was advanced to the common hepatic artery. Angiogram was performed. Catheter was then advanced into the right gastric artery. Angiogram was performed. Catheter was then advanced into the proper hepatic artery. Angiogram was performed. Catheter was advanced into the right hepatic artery. Angiogram was performed. Catheter was advanced into the cystic artery. Angiogram was performed. Catheter was used to select the segmental arteries of the right hepatic lobe, with angiogram performed within each artery. During the angiographic evaluation of the right hepatic segmental arteries, vascular shunts were identified, which pose a potential problem for increasing the a patent pulmonary shunt fraction and might limit any future Y 90 treatment. Given, we placed the microcatheter into segmental artery, right hepatic lobe, confirmed the vessels with likely the most degree of shunting, and embolized with 500-700 micron inter embospheres to slow the flow through the shunt. 2/3 of the bottle of 500-700 embosphere used for the for segmental artery. Repeat angiogram was performed. Catheter was withdrawn, and additional segmental arteries were interrogated of the right lobe and segment 4. Catheter was again withdrawn and used to select the left hepatic artery. Angiogram was performed. During the angiographic valuation of the left hepatic artery segmental arteries, additional vascular shunts were identified. Again these post potential problem for increasing the  pulmonary shunt fraction and might limit future Y 90 treatment. Given, EMBO sphere embolization was performed to slow the flow through the vascular shunts, at this site using 300-500 micron embospheres. Repeat angiogram was performed The high-flow catheter was then removed. A new 0.21 Lumen microcatheter was then used to select the right gastric artery. Coil embolization was performed with a single 3 mm x 10 cm coil. The microcatheter was then used to select the gastroduodenal artery. Angiogram was performed. Given that flow was completely reversed within the gastroduodenal artery, with strong single direction flow towards the liver, this artery was not prophylactically embolized. The microcatheter was withdrawn to just proximal to the gastroduodenal artery, and the MAA dose was administered. All catheters and wires were withdrawn and disposed, and Exoseal was deployed at the right common femoral artery access. Patient tolerated the procedure well and remained hemodynamically stable throughout. No complications were encountered and no significant blood loss. IMPRESSION: Status post ultrasound guided access right common femoral artery for mesenteric angiogram,  prophylactic micro sphere embolization of tumor vascular shunts, coil embolization of the right gastric artery, MAA administration, and deployment of Exoseal for hemostasis. Signed, Dulcy Fanny. Dellia Nims, RPVI [facility] Radiology Electronically Signed   By: Corrie Mckusick D.O.   On: 06/09/2018 12:38   Ir Angiogram Visceral Selective  Result Date: 06/09/2018 INDICATION: 84 year old male with a history of hepatocellular carcinoma, presenting today for pre Y 90 mapping EXAM: ULTRASOUND GUIDED ACCESS RIGHT COMMON FEMORAL ARTERY MESENTERIC ANGIOGRAM COIL EMBOLIZATION OF RIGHT GASTRIC ARTERY AS A POTENTIAL SOURCE FOR NON TARGET EMBOLIZATION SUPER SELECTIVE TUMOR EMBOLIZATION TO TREAT VASCULAR SHUNTS WHICH COULD  CONTRIBUTE TO INCREASED HEPATIC OF PULMONARY SHUNTING DEPLOYMENT OF EXOSEAL FOR HEMOSTASIS MEDICATIONS: None ANESTHESIA/SEDATION: Moderate (conscious) sedation was employed during this procedure. A total of Versed 4.0 mg and Fentanyl 100 mcg was administered intravenously. Moderate Sedation Time: 106 minutes. The patient's level of consciousness and vital signs were monitored continuously by radiology nursing throughout the procedure under my direct supervision. CONTRAST:  187 cc Omnipaque 300 FLUOROSCOPY TIME:  Fluoroscopy Time: 22 minutes 12 seconds (3683 mGy). COMPLICATIONS: None PROCEDURE: Informed consent was obtained from the patient following explanation of the procedure, risks, benefits and alternatives. The patient understands, agrees and consents for the procedure. All questions were addressed. A time out was performed prior to the initiation of the procedure. Maximal barrier sterile technique utilized including caps, mask, sterile gowns, sterile gloves, large sterile drape, hand hygiene, and Betadine prep. Ultrasound survey of the right inguinal region was performed with images stored and sent to PACs, confirming patency of the vessel. A micropuncture needle was used access the right common femoral artery under ultrasound. With excellent arterial blood flow returned, and an .018 micro wire was passed through the needle, observed enter the abdominal aorta under fluoroscopy. The needle was removed, and a micropuncture sheath was placed over the wire. The inner dilator and wire were removed, and an 035 Bentson wire was advanced under fluoroscopy into the abdominal aorta. The sheath was removed and a standard 5 [place] vascular sheath was placed. The dilator was removed and the sheath was flushed. Cobra catheter was advanced over the Bentson wire to the abdominal aorta. Catheter was used to select the celiac artery. Angiogram was performed. Catheter was withdrawn to the superior mesenteric artery origin.  Angiogram was performed. Catheter was then used to select the celiac artery, and a Glidewire was advanced in attempt to place the base catheter within the common hepatic artery. Given the angle of the celiac artery trunk, this was not possible. The Cobra catheter was then exchanged over a standard Bentson wire for a Mickelson catheter. With the [facility] catheter at the celiac artery origin, microcatheter system was advanced. Initially we used a 150 cm high-flow Renegade catheter and a fathom wire. Microcatheter was advanced to the common hepatic artery. Angiogram was performed. Catheter was then advanced into the right gastric artery. Angiogram was performed. Catheter was then advanced into the proper hepatic artery. Angiogram was performed. Catheter was advanced into the right hepatic artery. Angiogram was performed. Catheter was advanced into the cystic artery. Angiogram was performed. Catheter was used to select the segmental arteries of the right hepatic lobe, with angiogram performed within each artery. During the angiographic evaluation of the right hepatic segmental arteries, vascular shunts were identified, which pose a potential problem for increasing the a patent pulmonary shunt fraction and might limit any future Y 90 treatment. Given, we placed the microcatheter into segmental artery, right  hepatic lobe, confirmed the vessels with likely the most degree of shunting, and embolized with 500-700 micron inter embospheres to slow the flow through the shunt. 2/3 of the bottle of 500-700 embosphere used for the for segmental artery. Repeat angiogram was performed. Catheter was withdrawn, and additional segmental arteries were interrogated of the right lobe and segment 4. Catheter was again withdrawn and used to select the left hepatic artery. Angiogram was performed. During the angiographic valuation of the left hepatic artery segmental arteries, additional vascular shunts were identified. Again these post  potential problem for increasing the pulmonary shunt fraction and might limit future Y 90 treatment. Given, EMBO sphere embolization was performed to slow the flow through the vascular shunts, at this site using 300-500 micron embospheres. Repeat angiogram was performed The high-flow catheter was then removed. A new 0.21 Lumen microcatheter was then used to select the right gastric artery. Coil embolization was performed with a single 3 mm x 10 cm coil. The microcatheter was then used to select the gastroduodenal artery. Angiogram was performed. Given that flow was completely reversed within the gastroduodenal artery, with strong single direction flow towards the liver, this artery was not prophylactically embolized. The microcatheter was withdrawn to just proximal to the gastroduodenal artery, and the MAA dose was administered. All catheters and wires were withdrawn and disposed, and Exoseal was deployed at the right common femoral artery access. Patient tolerated the procedure well and remained hemodynamically stable throughout. No complications were encountered and no significant blood loss. IMPRESSION: Status post ultrasound guided access right common femoral artery for mesenteric angiogram, prophylactic micro sphere embolization of tumor vascular shunts, coil embolization of the right gastric artery, MAA administration, and deployment of Exoseal for hemostasis. Signed, Dulcy Fanny. Dellia Nims, RPVI [facility] Radiology Electronically Signed   By: Corrie Mckusick D.O.   On: 06/09/2018 12:38   Ir Angiogram Selective Each Additional Vessel  Result Date: 06/09/2018 INDICATION: 84 year old male with a history of hepatocellular carcinoma, presenting today for pre Y 90 mapping EXAM: ULTRASOUND GUIDED ACCESS RIGHT COMMON FEMORAL ARTERY MESENTERIC ANGIOGRAM COIL EMBOLIZATION OF RIGHT GASTRIC ARTERY AS A POTENTIAL SOURCE FOR NON TARGET EMBOLIZATION SUPER SELECTIVE TUMOR  EMBOLIZATION TO TREAT VASCULAR SHUNTS WHICH COULD CONTRIBUTE TO INCREASED HEPATIC OF PULMONARY SHUNTING DEPLOYMENT OF EXOSEAL FOR HEMOSTASIS MEDICATIONS: None ANESTHESIA/SEDATION: Moderate (conscious) sedation was employed during this procedure. A total of Versed 4.0 mg and Fentanyl 100 mcg was administered intravenously. Moderate Sedation Time: 106 minutes. The patient's level of consciousness and vital signs were monitored continuously by radiology nursing throughout the procedure under my direct supervision. CONTRAST:  187 cc Omnipaque 300 FLUOROSCOPY TIME:  Fluoroscopy Time: 22 minutes 12 seconds (3683 mGy). COMPLICATIONS: None PROCEDURE: Informed consent was obtained from the patient following explanation of the procedure, risks, benefits and alternatives. The patient understands, agrees and consents for the procedure. All questions were addressed. A time out was performed prior to the initiation of the procedure. Maximal barrier sterile technique utilized including caps, mask, sterile gowns, sterile gloves, large sterile drape, hand hygiene, and Betadine prep. Ultrasound survey of the right inguinal region was performed with images stored and sent to PACs, confirming patency of the vessel. A micropuncture needle was used access the right common femoral artery under ultrasound. With excellent arterial blood flow returned, and an .018 micro wire was passed through the needle, observed enter the abdominal aorta under fluoroscopy. The needle was removed, and a micropuncture sheath was placed over the wire. The inner  dilator and wire were removed, and an 035 Bentson wire was advanced under fluoroscopy into the abdominal aorta. The sheath was removed and a standard 5 [place] vascular sheath was placed. The dilator was removed and the sheath was flushed. Cobra catheter was advanced over the Bentson wire to the abdominal aorta. Catheter was used to select the celiac artery. Angiogram was performed. Catheter was  withdrawn to the superior mesenteric artery origin. Angiogram was performed. Catheter was then used to select the celiac artery, and a Glidewire was advanced in attempt to place the base catheter within the common hepatic artery. Given the angle of the celiac artery trunk, this was not possible. The Cobra catheter was then exchanged over a standard Bentson wire for a Mickelson catheter. With the [facility] catheter at the celiac artery origin, microcatheter system was advanced. Initially we used a 150 cm high-flow Renegade catheter and a fathom wire. Microcatheter was advanced to the common hepatic artery. Angiogram was performed. Catheter was then advanced into the right gastric artery. Angiogram was performed. Catheter was then advanced into the proper hepatic artery. Angiogram was performed. Catheter was advanced into the right hepatic artery. Angiogram was performed. Catheter was advanced into the cystic artery. Angiogram was performed. Catheter was used to select the segmental arteries of the right hepatic lobe, with angiogram performed within each artery. During the angiographic evaluation of the right hepatic segmental arteries, vascular shunts were identified, which pose a potential problem for increasing the a patent pulmonary shunt fraction and might limit any future Y 90 treatment. Given, we placed the microcatheter into segmental artery, right hepatic lobe, confirmed the vessels with likely the most degree of shunting, and embolized with 500-700 micron inter embospheres to slow the flow through the shunt. 2/3 of the bottle of 500-700 embosphere used for the for segmental artery. Repeat angiogram was performed. Catheter was withdrawn, and additional segmental arteries were interrogated of the right lobe and segment 4. Catheter was again withdrawn and used to select the left hepatic artery. Angiogram was performed. During the angiographic valuation of the left hepatic artery segmental arteries, additional  vascular shunts were identified. Again these post potential problem for increasing the pulmonary shunt fraction and might limit future Y 90 treatment. Given, EMBO sphere embolization was performed to slow the flow through the vascular shunts, at this site using 300-500 micron embospheres. Repeat angiogram was performed The high-flow catheter was then removed. A new 0.21 Lumen microcatheter was then used to select the right gastric artery. Coil embolization was performed with a single 3 mm x 10 cm coil. The microcatheter was then used to select the gastroduodenal artery. Angiogram was performed. Given that flow was completely reversed within the gastroduodenal artery, with strong single direction flow towards the liver, this artery was not prophylactically embolized. The microcatheter was withdrawn to just proximal to the gastroduodenal artery, and the MAA dose was administered. All catheters and wires were withdrawn and disposed, and Exoseal was deployed at the right common femoral artery access. Patient tolerated the procedure well and remained hemodynamically stable throughout. No complications were encountered and no significant blood loss. IMPRESSION: Status post ultrasound guided access right common femoral artery for mesenteric angiogram, prophylactic micro sphere embolization of tumor vascular shunts, coil embolization of the right gastric artery, MAA administration, and deployment of Exoseal for hemostasis. Signed, Dulcy Fanny. Dellia Nims, RPVI [facility] Radiology Specialists [facility] Radiology Electronically Signed   By: Corrie Mckusick D.O.   On: 06/09/2018 12:38   Ir Angiogram  Selective Each Additional Vessel  Result Date: 06/09/2018 INDICATION: 84 year old male with a history of hepatocellular carcinoma, presenting today for pre Y 90 mapping EXAM: ULTRASOUND GUIDED ACCESS RIGHT COMMON FEMORAL ARTERY MESENTERIC ANGIOGRAM COIL EMBOLIZATION OF RIGHT GASTRIC ARTERY AS A POTENTIAL SOURCE FOR  NON TARGET EMBOLIZATION SUPER SELECTIVE TUMOR EMBOLIZATION TO TREAT VASCULAR SHUNTS WHICH COULD CONTRIBUTE TO INCREASED HEPATIC OF PULMONARY SHUNTING DEPLOYMENT OF EXOSEAL FOR HEMOSTASIS MEDICATIONS: None ANESTHESIA/SEDATION: Moderate (conscious) sedation was employed during this procedure. A total of Versed 4.0 mg and Fentanyl 100 mcg was administered intravenously. Moderate Sedation Time: 106 minutes. The patient's level of consciousness and vital signs were monitored continuously by radiology nursing throughout the procedure under my direct supervision. CONTRAST:  187 cc Omnipaque 300 FLUOROSCOPY TIME:  Fluoroscopy Time: 22 minutes 12 seconds (3683 mGy). COMPLICATIONS: None PROCEDURE: Informed consent was obtained from the patient following explanation of the procedure, risks, benefits and alternatives. The patient understands, agrees and consents for the procedure. All questions were addressed. A time out was performed prior to the initiation of the procedure. Maximal barrier sterile technique utilized including caps, mask, sterile gowns, sterile gloves, large sterile drape, hand hygiene, and Betadine prep. Ultrasound survey of the right inguinal region was performed with images stored and sent to PACs, confirming patency of the vessel. A micropuncture needle was used access the right common femoral artery under ultrasound. With excellent arterial blood flow returned, and an .018 micro wire was passed through the needle, observed enter the abdominal aorta under fluoroscopy. The needle was removed, and a micropuncture sheath was placed over the wire. The inner dilator and wire were removed, and an 035 Bentson wire was advanced under fluoroscopy into the abdominal aorta. The sheath was removed and a standard 5 [place] vascular sheath was placed. The dilator was removed and the sheath was flushed. Cobra catheter was advanced over the Bentson wire to the abdominal aorta. Catheter was used to select the celiac artery.  Angiogram was performed. Catheter was withdrawn to the superior mesenteric artery origin. Angiogram was performed. Catheter was then used to select the celiac artery, and a Glidewire was advanced in attempt to place the base catheter within the common hepatic artery. Given the angle of the celiac artery trunk, this was not possible. The Cobra catheter was then exchanged over a standard Bentson wire for a Mickelson catheter. With the [facility] catheter at the celiac artery origin, microcatheter system was advanced. Initially we used a 150 cm high-flow Renegade catheter and a fathom wire. Microcatheter was advanced to the common hepatic artery. Angiogram was performed. Catheter was then advanced into the right gastric artery. Angiogram was performed. Catheter was then advanced into the proper hepatic artery. Angiogram was performed. Catheter was advanced into the right hepatic artery. Angiogram was performed. Catheter was advanced into the cystic artery. Angiogram was performed. Catheter was used to select the segmental arteries of the right hepatic lobe, with angiogram performed within each artery. During the angiographic evaluation of the right hepatic segmental arteries, vascular shunts were identified, which pose a potential problem for increasing the a patent pulmonary shunt fraction and might limit any future Y 90 treatment. Given, we placed the microcatheter into segmental artery, right hepatic lobe, confirmed the vessels with likely the most degree of shunting, and embolized with 500-700 micron inter embospheres to slow the flow through the shunt. 2/3 of the bottle of 500-700 embosphere used for the for segmental artery. Repeat angiogram was performed. Catheter was withdrawn, and additional segmental arteries were interrogated  of the right lobe and segment 4. Catheter was again withdrawn and used to select the left hepatic artery. Angiogram was performed. During the angiographic valuation of the left hepatic  artery segmental arteries, additional vascular shunts were identified. Again these post potential problem for increasing the pulmonary shunt fraction and might limit future Y 90 treatment. Given, EMBO sphere embolization was performed to slow the flow through the vascular shunts, at this site using 300-500 micron embospheres. Repeat angiogram was performed The high-flow catheter was then removed. A new 0.21 Lumen microcatheter was then used to select the right gastric artery. Coil embolization was performed with a single 3 mm x 10 cm coil. The microcatheter was then used to select the gastroduodenal artery. Angiogram was performed. Given that flow was completely reversed within the gastroduodenal artery, with strong single direction flow towards the liver, this artery was not prophylactically embolized. The microcatheter was withdrawn to just proximal to the gastroduodenal artery, and the MAA dose was administered. All catheters and wires were withdrawn and disposed, and Exoseal was deployed at the right common femoral artery access. Patient tolerated the procedure well and remained hemodynamically stable throughout. No complications were encountered and no significant blood loss. IMPRESSION: Status post ultrasound guided access right common femoral artery for mesenteric angiogram, prophylactic micro sphere embolization of tumor vascular shunts, coil embolization of the right gastric artery, MAA administration, and deployment of Exoseal for hemostasis. Signed, Dulcy Fanny. Dellia Nims, RPVI Vascular and Interventional Radiology Specialists [facility] Radiology Electronically Signed   By: Corrie Mckusick D.O.   On: 06/09/2018 12:38   Ir Angiogram Selective Each Additional Vessel  Result Date: 06/09/2018 INDICATION: 84 year old male with a history of hepatocellular carcinoma, presenting today for pre Y 90 mapping EXAM: ULTRASOUND GUIDED ACCESS RIGHT COMMON FEMORAL ARTERY MESENTERIC ANGIOGRAM COIL EMBOLIZATION OF RIGHT  GASTRIC ARTERY AS A POTENTIAL SOURCE FOR NON TARGET EMBOLIZATION SUPER SELECTIVE TUMOR EMBOLIZATION TO TREAT VASCULAR SHUNTS WHICH COULD CONTRIBUTE TO INCREASED HEPATIC OF PULMONARY SHUNTING DEPLOYMENT OF EXOSEAL FOR HEMOSTASIS MEDICATIONS: None ANESTHESIA/SEDATION: Moderate (conscious) sedation was employed during this procedure. A total of Versed 4.0 mg and Fentanyl 100 mcg was administered intravenously. Moderate Sedation Time: 106 minutes. The patient's level of consciousness and vital signs were monitored continuously by radiology nursing throughout the procedure under my direct supervision. CONTRAST:  187 cc Omnipaque 300 FLUOROSCOPY TIME:  Fluoroscopy Time: 22 minutes 12 seconds (3683 mGy). COMPLICATIONS: None PROCEDURE: Informed consent was obtained from the patient following explanation of the procedure, risks, benefits and alternatives. The patient understands, agrees and consents for the procedure. All questions were addressed. A time out was performed prior to the initiation of the procedure. Maximal barrier sterile technique utilized including caps, mask, sterile gowns, sterile gloves, large sterile drape, hand hygiene, and Betadine prep. Ultrasound survey of the right inguinal region was performed with images stored and sent to PACs, confirming patency of the vessel. A micropuncture needle was used access the right common femoral artery under ultrasound. With excellent arterial blood flow returned, and an .018 micro wire was passed through the needle, observed enter the abdominal aorta under fluoroscopy. The needle was removed, and a micropuncture sheath was placed over the wire. The inner dilator and wire were removed, and an 035 Bentson wire was advanced under fluoroscopy into the abdominal aorta. The sheath was removed and a standard 5 [place] vascular sheath was placed. The dilator was removed and the sheath was flushed. Cobra catheter was advanced over the Bentson wire to the abdominal aorta.  Catheter was used to select the celiac artery. Angiogram was performed. Catheter was withdrawn to the superior mesenteric artery origin. Angiogram was performed. Catheter was then used to select the celiac artery, and a Glidewire was advanced in attempt to place the base catheter within the common hepatic artery. Given the angle of the celiac artery trunk, this was not possible. The Cobra catheter was then exchanged over a standard Bentson wire for a Mickelson catheter. With the [facility] catheter at the celiac artery origin, microcatheter system was advanced. Initially we used a 150 cm high-flow Renegade catheter and a fathom wire. Microcatheter was advanced to the common hepatic artery. Angiogram was performed. Catheter was then advanced into the right gastric artery. Angiogram was performed. Catheter was then advanced into the proper hepatic artery. Angiogram was performed. Catheter was advanced into the right hepatic artery. Angiogram was performed. Catheter was advanced into the cystic artery. Angiogram was performed. Catheter was used to select the segmental arteries of the right hepatic lobe, with angiogram performed within each artery. During the angiographic evaluation of the right hepatic segmental arteries, vascular shunts were identified, which pose a potential problem for increasing the a patent pulmonary shunt fraction and might limit any future Y 90 treatment. Given, we placed the microcatheter into segmental artery, right hepatic lobe, confirmed the vessels with likely the most degree of shunting, and embolized with 500-700 micron inter embospheres to slow the flow through the shunt. 2/3 of the bottle of 500-700 embosphere used for the for segmental artery. Repeat angiogram was performed. Catheter was withdrawn, and additional segmental arteries were interrogated of the right lobe and segment 4. Catheter was again withdrawn and used to select the left hepatic artery. Angiogram was performed. During  the angiographic valuation of the left hepatic artery segmental arteries, additional vascular shunts were identified. Again these post potential problem for increasing the pulmonary shunt fraction and might limit future Y 90 treatment. Given, EMBO sphere embolization was performed to slow the flow through the vascular shunts, at this site using 300-500 micron embospheres. Repeat angiogram was performed The high-flow catheter was then removed. A new 0.21 Lumen microcatheter was then used to select the right gastric artery. Coil embolization was performed with a single 3 mm x 10 cm coil. The microcatheter was then used to select the gastroduodenal artery. Angiogram was performed. Given that flow was completely reversed within the gastroduodenal artery, with strong single direction flow towards the liver, this artery was not prophylactically embolized. The microcatheter was withdrawn to just proximal to the gastroduodenal artery, and the MAA dose was administered. All catheters and wires were withdrawn and disposed, and Exoseal was deployed at the right common femoral artery access. Patient tolerated the procedure well and remained hemodynamically stable throughout. No complications were encountered and no significant blood loss. IMPRESSION: Status post ultrasound guided access right common femoral artery for mesenteric angiogram, prophylactic micro sphere embolization of tumor vascular shunts, coil embolization of the right gastric artery, MAA administration, and deployment of Exoseal for hemostasis. Signed, Dulcy Fanny. Dellia Nims, RPVI Vascular and Interventional Radiology Specialists [facility] Radiology Electronically Signed   By: Corrie Mckusick D.O.   On: 06/09/2018 12:38   Ir Angiogram Selective Each Additional Vessel  Result Date: 06/09/2018 INDICATION: 84 year old male with a history of hepatocellular carcinoma, presenting today for pre Y 90 mapping EXAM: ULTRASOUND GUIDED ACCESS RIGHT COMMON FEMORAL ARTERY  MESENTERIC ANGIOGRAM COIL EMBOLIZATION OF RIGHT GASTRIC ARTERY AS A POTENTIAL SOURCE FOR NON TARGET EMBOLIZATION SUPER SELECTIVE TUMOR EMBOLIZATION  TO TREAT VASCULAR SHUNTS WHICH COULD CONTRIBUTE TO INCREASED HEPATIC OF PULMONARY SHUNTING DEPLOYMENT OF EXOSEAL FOR HEMOSTASIS MEDICATIONS: None ANESTHESIA/SEDATION: Moderate (conscious) sedation was employed during this procedure. A total of Versed 4.0 mg and Fentanyl 100 mcg was administered intravenously. Moderate Sedation Time: 106 minutes. The patient's level of consciousness and vital signs were monitored continuously by radiology nursing throughout the procedure under my direct supervision. CONTRAST:  187 cc Omnipaque 300 FLUOROSCOPY TIME:  Fluoroscopy Time: 22 minutes 12 seconds (3683 mGy). COMPLICATIONS: None PROCEDURE: Informed consent was obtained from the patient following explanation of the procedure, risks, benefits and alternatives. The patient understands, agrees and consents for the procedure. All questions were addressed. A time out was performed prior to the initiation of the procedure. Maximal barrier sterile technique utilized including caps, mask, sterile gowns, sterile gloves, large sterile drape, hand hygiene, and Betadine prep. Ultrasound survey of the right inguinal region was performed with images stored and sent to PACs, confirming patency of the vessel. A micropuncture needle was used access the right common femoral artery under ultrasound. With excellent arterial blood flow returned, and an .018 micro wire was passed through the needle, observed enter the abdominal aorta under fluoroscopy. The needle was removed, and a micropuncture sheath was placed over the wire. The inner dilator and wire were removed, and an 035 Bentson wire was advanced under fluoroscopy into the abdominal aorta. The sheath was removed and a standard 5 [place] vascular sheath was placed. The dilator was removed and the sheath was flushed. Cobra catheter was advanced  over the Bentson wire to the abdominal aorta. Catheter was used to select the celiac artery. Angiogram was performed. Catheter was withdrawn to the superior mesenteric artery origin. Angiogram was performed. Catheter was then used to select the celiac artery, and a Glidewire was advanced in attempt to place the base catheter within the common hepatic artery. Given the angle of the celiac artery trunk, this was not possible. The Cobra catheter was then exchanged over a standard Bentson wire for a Mickelson catheter. With the [facility] catheter at the celiac artery origin, microcatheter system was advanced. Initially we used a 150 cm high-flow Renegade catheter and a fathom wire. Microcatheter was advanced to the common hepatic artery. Angiogram was performed. Catheter was then advanced into the right gastric artery. Angiogram was performed. Catheter was then advanced into the proper hepatic artery. Angiogram was performed. Catheter was advanced into the right hepatic artery. Angiogram was performed. Catheter was advanced into the cystic artery. Angiogram was performed. Catheter was used to select the segmental arteries of the right hepatic lobe, with angiogram performed within each artery. During the angiographic evaluation of the right hepatic segmental arteries, vascular shunts were identified, which pose a potential problem for increasing the a patent pulmonary shunt fraction and might limit any future Y 90 treatment. Given, we placed the microcatheter into segmental artery, right hepatic lobe, confirmed the vessels with likely the most degree of shunting, and embolized with 500-700 micron inter embospheres to slow the flow through the shunt. 2/3 of the bottle of 500-700 embosphere used for the for segmental artery. Repeat angiogram was performed. Catheter was withdrawn, and additional segmental arteries were interrogated of the right lobe and segment 4. Catheter was again withdrawn and used to select the left  hepatic artery. Angiogram was performed. During the angiographic valuation of the left hepatic artery segmental arteries, additional vascular shunts were identified. Again these post potential problem for increasing the pulmonary shunt fraction and might limit  future Y 90 treatment. Given, EMBO sphere embolization was performed to slow the flow through the vascular shunts, at this site using 300-500 micron embospheres. Repeat angiogram was performed The high-flow catheter was then removed. A new 0.21 Lumen microcatheter was then used to select the right gastric artery. Coil embolization was performed with a single 3 mm x 10 cm coil. The microcatheter was then used to select the gastroduodenal artery. Angiogram was performed. Given that flow was completely reversed within the gastroduodenal artery, with strong single direction flow towards the liver, this artery was not prophylactically embolized. The microcatheter was withdrawn to just proximal to the gastroduodenal artery, and the MAA dose was administered. All catheters and wires were withdrawn and disposed, and Exoseal was deployed at the right common femoral artery access. Patient tolerated the procedure well and remained hemodynamically stable throughout. No complications were encountered and no significant blood loss. IMPRESSION: Status post ultrasound guided access right common femoral artery for mesenteric angiogram, prophylactic micro sphere embolization of tumor vascular shunts, coil embolization of the right gastric artery, MAA administration, and deployment of Exoseal for hemostasis. Signed, Dulcy Fanny. Dellia Nims, RPVI Vascular and Interventional Radiology Specialists [facility] Radiology Electronically Signed   By: Corrie Mckusick D.O.   On: 06/09/2018 12:38   Ir Angiogram Selective Each Additional Vessel  Result Date: 06/09/2018 INDICATION: 84 year old male with a history of hepatocellular carcinoma, presenting today for pre Y 90 mapping EXAM:  ULTRASOUND GUIDED ACCESS RIGHT COMMON FEMORAL ARTERY MESENTERIC ANGIOGRAM COIL EMBOLIZATION OF RIGHT GASTRIC ARTERY AS A POTENTIAL SOURCE FOR NON TARGET EMBOLIZATION SUPER SELECTIVE TUMOR EMBOLIZATION TO TREAT VASCULAR SHUNTS WHICH COULD CONTRIBUTE TO INCREASED HEPATIC OF PULMONARY SHUNTING DEPLOYMENT OF EXOSEAL FOR HEMOSTASIS MEDICATIONS: None ANESTHESIA/SEDATION: Moderate (conscious) sedation was employed during this procedure. A total of Versed 4.0 mg and Fentanyl 100 mcg was administered intravenously. Moderate Sedation Time: 106 minutes. The patient's level of consciousness and vital signs were monitored continuously by radiology nursing throughout the procedure under my direct supervision. CONTRAST:  187 cc Omnipaque 300 FLUOROSCOPY TIME:  Fluoroscopy Time: 22 minutes 12 seconds (3683 mGy). COMPLICATIONS: None PROCEDURE: Informed consent was obtained from the patient following explanation of the procedure, risks, benefits and alternatives. The patient understands, agrees and consents for the procedure. All questions were addressed. A time out was performed prior to the initiation of the procedure. Maximal barrier sterile technique utilized including caps, mask, sterile gowns, sterile gloves, large sterile drape, hand hygiene, and Betadine prep. Ultrasound survey of the right inguinal region was performed with images stored and sent to PACs, confirming patency of the vessel. A micropuncture needle was used access the right common femoral artery under ultrasound. With excellent arterial blood flow returned, and an .018 micro wire was passed through the needle, observed enter the abdominal aorta under fluoroscopy. The needle was removed, and a micropuncture sheath was placed over the wire. The inner dilator and wire were removed, and an 035 Bentson wire was advanced under fluoroscopy into the abdominal aorta. The sheath was removed and a standard 5 [place] vascular sheath was placed. The dilator was removed and  the sheath was flushed. Cobra catheter was advanced over the Bentson wire to the abdominal aorta. Catheter was used to select the celiac artery. Angiogram was performed. Catheter was withdrawn to the superior mesenteric artery origin. Angiogram was performed. Catheter was then used to select the celiac artery, and a Glidewire was advanced in attempt to place the base catheter within the common hepatic artery. Given the angle  of the celiac artery trunk, this was not possible. The Cobra catheter was then exchanged over a standard Bentson wire for a Mickelson catheter. With the [facility] catheter at the celiac artery origin, microcatheter system was advanced. Initially we used a 150 cm high-flow Renegade catheter and a fathom wire. Microcatheter was advanced to the common hepatic artery. Angiogram was performed. Catheter was then advanced into the right gastric artery. Angiogram was performed. Catheter was then advanced into the proper hepatic artery. Angiogram was performed. Catheter was advanced into the right hepatic artery. Angiogram was performed. Catheter was advanced into the cystic artery. Angiogram was performed. Catheter was used to select the segmental arteries of the right hepatic lobe, with angiogram performed within each artery. During the angiographic evaluation of the right hepatic segmental arteries, vascular shunts were identified, which pose a potential problem for increasing the a patent pulmonary shunt fraction and might limit any future Y 90 treatment. Given, we placed the microcatheter into segmental artery, right hepatic lobe, confirmed the vessels with likely the most degree of shunting, and embolized with 500-700 micron inter embospheres to slow the flow through the shunt. 2/3 of the bottle of 500-700 embosphere used for the for segmental artery. Repeat angiogram was performed. Catheter was withdrawn, and additional segmental arteries were interrogated of the right lobe and segment 4. Catheter  was again withdrawn and used to select the left hepatic artery. Angiogram was performed. During the angiographic valuation of the left hepatic artery segmental arteries, additional vascular shunts were identified. Again these post potential problem for increasing the pulmonary shunt fraction and might limit future Y 90 treatment. Given, EMBO sphere embolization was performed to slow the flow through the vascular shunts, at this site using 300-500 micron embospheres. Repeat angiogram was performed The high-flow catheter was then removed. A new 0.21 Lumen microcatheter was then used to select the right gastric artery. Coil embolization was performed with a single 3 mm x 10 cm coil. The microcatheter was then used to select the gastroduodenal artery. Angiogram was performed. Given that flow was completely reversed within the gastroduodenal artery, with strong single direction flow towards the liver, this artery was not prophylactically embolized. The microcatheter was withdrawn to just proximal to the gastroduodenal artery, and the MAA dose was administered. All catheters and wires were withdrawn and disposed, and Exoseal was deployed at the right common femoral artery access. Patient tolerated the procedure well and remained hemodynamically stable throughout. No complications were encountered and no significant blood loss. IMPRESSION: Status post ultrasound guided access right common femoral artery for mesenteric angiogram, prophylactic micro sphere embolization of tumor vascular shunts, coil embolization of the right gastric artery, MAA administration, and deployment of Exoseal for hemostasis. Signed, Dulcy Fanny. Dellia Nims, RPVI Vascular and Interventional Radiology Specialists [facility] Radiology Electronically Signed   By: Corrie Mckusick D.O.   On: 06/09/2018 12:38   Ir Angiogram Selective Each Additional Vessel  Result Date: 06/09/2018 INDICATION: 84 year old male with a history of hepatocellular carcinoma,  presenting today for pre Y 90 mapping EXAM: ULTRASOUND GUIDED ACCESS RIGHT COMMON FEMORAL ARTERY MESENTERIC ANGIOGRAM COIL EMBOLIZATION OF RIGHT GASTRIC ARTERY AS A POTENTIAL SOURCE FOR NON TARGET EMBOLIZATION SUPER SELECTIVE TUMOR EMBOLIZATION TO TREAT VASCULAR SHUNTS WHICH COULD CONTRIBUTE TO INCREASED HEPATIC OF PULMONARY SHUNTING DEPLOYMENT OF EXOSEAL FOR HEMOSTASIS MEDICATIONS: None ANESTHESIA/SEDATION: Moderate (conscious) sedation was employed during this procedure. A total of Versed 4.0 mg and Fentanyl 100 mcg was administered intravenously. Moderate Sedation Time: 106 minutes. The patient's level of consciousness  and vital signs were monitored continuously by radiology nursing throughout the procedure under my direct supervision. CONTRAST:  187 cc Omnipaque 300 FLUOROSCOPY TIME:  Fluoroscopy Time: 22 minutes 12 seconds (3683 mGy). COMPLICATIONS: None PROCEDURE: Informed consent was obtained from the patient following explanation of the procedure, risks, benefits and alternatives. The patient understands, agrees and consents for the procedure. All questions were addressed. A time out was performed prior to the initiation of the procedure. Maximal barrier sterile technique utilized including caps, mask, sterile gowns, sterile gloves, large sterile drape, hand hygiene, and Betadine prep. Ultrasound survey of the right inguinal region was performed with images stored and sent to PACs, confirming patency of the vessel. A micropuncture needle was used access the right common femoral artery under ultrasound. With excellent arterial blood flow returned, and an .018 micro wire was passed through the needle, observed enter the abdominal aorta under fluoroscopy. The needle was removed, and a micropuncture sheath was placed over the wire. The inner dilator and wire were removed, and an 035 Bentson wire was advanced under fluoroscopy into the abdominal aorta. The sheath was removed and a standard 5 [place] vascular  sheath was placed. The dilator was removed and the sheath was flushed. Cobra catheter was advanced over the Bentson wire to the abdominal aorta. Catheter was used to select the celiac artery. Angiogram was performed. Catheter was withdrawn to the superior mesenteric artery origin. Angiogram was performed. Catheter was then used to select the celiac artery, and a Glidewire was advanced in attempt to place the base catheter within the common hepatic artery. Given the angle of the celiac artery trunk, this was not possible. The Cobra catheter was then exchanged over a standard Bentson wire for a Mickelson catheter. With the [facility] catheter at the celiac artery origin, microcatheter system was advanced. Initially we used a 150 cm high-flow Renegade catheter and a fathom wire. Microcatheter was advanced to the common hepatic artery. Angiogram was performed. Catheter was then advanced into the right gastric artery. Angiogram was performed. Catheter was then advanced into the proper hepatic artery. Angiogram was performed. Catheter was advanced into the right hepatic artery. Angiogram was performed. Catheter was advanced into the cystic artery. Angiogram was performed. Catheter was used to select the segmental arteries of the right hepatic lobe, with angiogram performed within each artery. During the angiographic evaluation of the right hepatic segmental arteries, vascular shunts were identified, which pose a potential problem for increasing the a patent pulmonary shunt fraction and might limit any future Y 90 treatment. Given, we placed the microcatheter into segmental artery, right hepatic lobe, confirmed the vessels with likely the most degree of shunting, and embolized with 500-700 micron inter embospheres to slow the flow through the shunt. 2/3 of the bottle of 500-700 embosphere used for the for segmental artery. Repeat angiogram was performed. Catheter was withdrawn, and additional segmental arteries were  interrogated of the right lobe and segment 4. Catheter was again withdrawn and used to select the left hepatic artery. Angiogram was performed. During the angiographic valuation of the left hepatic artery segmental arteries, additional vascular shunts were identified. Again these post potential problem for increasing the pulmonary shunt fraction and might limit future Y 90 treatment. Given, EMBO sphere embolization was performed to slow the flow through the vascular shunts, at this site using 300-500 micron embospheres. Repeat angiogram was performed The high-flow catheter was then removed. A new 0.21 Lumen microcatheter was then used to select the right gastric artery. Coil embolization was  performed with a single 3 mm x 10 cm coil. The microcatheter was then used to select the gastroduodenal artery. Angiogram was performed. Given that flow was completely reversed within the gastroduodenal artery, with strong single direction flow towards the liver, this artery was not prophylactically embolized. The microcatheter was withdrawn to just proximal to the gastroduodenal artery, and the MAA dose was administered. All catheters and wires were withdrawn and disposed, and Exoseal was deployed at the right common femoral artery access. Patient tolerated the procedure well and remained hemodynamically stable throughout. No complications were encountered and no significant blood loss. IMPRESSION: Status post ultrasound guided access right common femoral artery for mesenteric angiogram, prophylactic micro sphere embolization of tumor vascular shunts, coil embolization of the right gastric artery, MAA administration, and deployment of Exoseal for hemostasis. Signed, Dulcy Fanny. Dellia Nims, RPVI Vascular and Interventional Radiology Specialists [facility] Radiology Electronically Signed   By: Corrie Mckusick D.O.   On: 06/09/2018 12:38   Ir Angiogram Selective Each Additional Vessel  Result Date: 06/09/2018 INDICATION: 84 year old  male with a history of hepatocellular carcinoma, presenting today for pre Y 90 mapping EXAM: ULTRASOUND GUIDED ACCESS RIGHT COMMON FEMORAL ARTERY MESENTERIC ANGIOGRAM COIL EMBOLIZATION OF RIGHT GASTRIC ARTERY AS A POTENTIAL SOURCE FOR NON TARGET EMBOLIZATION SUPER SELECTIVE TUMOR EMBOLIZATION TO TREAT VASCULAR SHUNTS WHICH COULD CONTRIBUTE TO INCREASED HEPATIC OF PULMONARY SHUNTING DEPLOYMENT OF EXOSEAL FOR HEMOSTASIS MEDICATIONS: None ANESTHESIA/SEDATION: Moderate (conscious) sedation was employed during this procedure. A total of Versed 4.0 mg and Fentanyl 100 mcg was administered intravenously. Moderate Sedation Time: 106 minutes. The patient's level of consciousness and vital signs were monitored continuously by radiology nursing throughout the procedure under my direct supervision. CONTRAST:  187 cc Omnipaque 300 FLUOROSCOPY TIME:  Fluoroscopy Time: 22 minutes 12 seconds (3683 mGy). COMPLICATIONS: None PROCEDURE: Informed consent was obtained from the patient following explanation of the procedure, risks, benefits and alternatives. The patient understands, agrees and consents for the procedure. All questions were addressed. A time out was performed prior to the initiation of the procedure. Maximal barrier sterile technique utilized including caps, mask, sterile gowns, sterile gloves, large sterile drape, hand hygiene, and Betadine prep. Ultrasound survey of the right inguinal region was performed with images stored and sent to PACs, confirming patency of the vessel. A micropuncture needle was used access the right common femoral artery under ultrasound. With excellent arterial blood flow returned, and an .018 micro wire was passed through the needle, observed enter the abdominal aorta under fluoroscopy. The needle was removed, and a micropuncture sheath was placed over the wire. The inner dilator and wire were removed, and an 035 Bentson wire was advanced under fluoroscopy into the abdominal aorta. The sheath  was removed and a standard 5 [place] vascular sheath was placed. The dilator was removed and the sheath was flushed. Cobra catheter was advanced over the Bentson wire to the abdominal aorta. Catheter was used to select the celiac artery. Angiogram was performed. Catheter was withdrawn to the superior mesenteric artery origin. Angiogram was performed. Catheter was then used to select the celiac artery, and a Glidewire was advanced in attempt to place the base catheter within the common hepatic artery. Given the angle of the celiac artery trunk, this was not possible. The Cobra catheter was then exchanged over a standard Bentson wire for a Mickelson catheter. With the [facility] catheter at the celiac artery origin, microcatheter system was advanced. Initially we used a 150 cm high-flow Renegade catheter and a fathom wire. Microcatheter was  advanced to the common hepatic artery. Angiogram was performed. Catheter was then advanced into the right gastric artery. Angiogram was performed. Catheter was then advanced into the proper hepatic artery. Angiogram was performed. Catheter was advanced into the right hepatic artery. Angiogram was performed. Catheter was advanced into the cystic artery. Angiogram was performed. Catheter was used to select the segmental arteries of the right hepatic lobe, with angiogram performed within each artery. During the angiographic evaluation of the right hepatic segmental arteries, vascular shunts were identified, which pose a potential problem for increasing the a patent pulmonary shunt fraction and might limit any future Y 90 treatment. Given, we placed the microcatheter into segmental artery, right hepatic lobe, confirmed the vessels with likely the most degree of shunting, and embolized with 500-700 micron inter embospheres to slow the flow through the shunt. 2/3 of the bottle of 500-700 embosphere used for the for segmental artery. Repeat angiogram was performed. Catheter was withdrawn,  and additional segmental arteries were interrogated of the right lobe and segment 4. Catheter was again withdrawn and used to select the left hepatic artery. Angiogram was performed. During the angiographic valuation of the left hepatic artery segmental arteries, additional vascular shunts were identified. Again these post potential problem for increasing the pulmonary shunt fraction and might limit future Y 90 treatment. Given, EMBO sphere embolization was performed to slow the flow through the vascular shunts, at this site using 300-500 micron embospheres. Repeat angiogram was performed The high-flow catheter was then removed. A new 0.21 Lumen microcatheter was then used to select the right gastric artery. Coil embolization was performed with a single 3 mm x 10 cm coil. The microcatheter was then used to select the gastroduodenal artery. Angiogram was performed. Given that flow was completely reversed within the gastroduodenal artery, with strong single direction flow towards the liver, this artery was not prophylactically embolized. The microcatheter was withdrawn to just proximal to the gastroduodenal artery, and the MAA dose was administered. All catheters and wires were withdrawn and disposed, and Exoseal was deployed at the right common femoral artery access. Patient tolerated the procedure well and remained hemodynamically stable throughout. No complications were encountered and no significant blood loss. IMPRESSION: Status post ultrasound guided access right common femoral artery for mesenteric angiogram, prophylactic micro sphere embolization of tumor vascular shunts, coil embolization of the right gastric artery, MAA administration, and deployment of Exoseal for hemostasis. Signed, Dulcy Fanny. Dellia Nims, RPVI Vascular and Interventional Radiology Specialists [facility] Radiology Electronically Signed   By: Corrie Mckusick D.O.   On: 06/09/2018 12:38   Ir Angiogram Selective Each Additional Vessel  Result  Date: 06/09/2018 INDICATION: 84 year old male with a history of hepatocellular carcinoma, presenting today for pre Y 90 mapping EXAM: ULTRASOUND GUIDED ACCESS RIGHT COMMON FEMORAL ARTERY MESENTERIC ANGIOGRAM COIL EMBOLIZATION OF RIGHT GASTRIC ARTERY AS A POTENTIAL SOURCE FOR NON TARGET EMBOLIZATION SUPER SELECTIVE TUMOR EMBOLIZATION TO TREAT VASCULAR SHUNTS WHICH COULD CONTRIBUTE TO INCREASED HEPATIC OF PULMONARY SHUNTING DEPLOYMENT OF EXOSEAL FOR HEMOSTASIS MEDICATIONS: None ANESTHESIA/SEDATION: Moderate (conscious) sedation was employed during this procedure. A total of Versed 4.0 mg and Fentanyl 100 mcg was administered intravenously. Moderate Sedation Time: 106 minutes. The patient's level of consciousness and vital signs were monitored continuously by radiology nursing throughout the procedure under my direct supervision. CONTRAST:  187 cc Omnipaque 300 FLUOROSCOPY TIME:  Fluoroscopy Time: 22 minutes 12 seconds (3683 mGy). COMPLICATIONS: None PROCEDURE: Informed consent was obtained from the patient following explanation of the procedure, risks, benefits and alternatives.  The patient understands, agrees and consents for the procedure. All questions were addressed. A time out was performed prior to the initiation of the procedure. Maximal barrier sterile technique utilized including caps, mask, sterile gowns, sterile gloves, large sterile drape, hand hygiene, and Betadine prep. Ultrasound survey of the right inguinal region was performed with images stored and sent to PACs, confirming patency of the vessel. A micropuncture needle was used access the right common femoral artery under ultrasound. With excellent arterial blood flow returned, and an .018 micro wire was passed through the needle, observed enter the abdominal aorta under fluoroscopy. The needle was removed, and a micropuncture sheath was placed over the wire. The inner dilator and wire were removed, and an 035 Bentson wire was advanced under  fluoroscopy into the abdominal aorta. The sheath was removed and a standard 5 [place] vascular sheath was placed. The dilator was removed and the sheath was flushed. Cobra catheter was advanced over the Bentson wire to the abdominal aorta. Catheter was used to select the celiac artery. Angiogram was performed. Catheter was withdrawn to the superior mesenteric artery origin. Angiogram was performed. Catheter was then used to select the celiac artery, and a Glidewire was advanced in attempt to place the base catheter within the common hepatic artery. Given the angle of the celiac artery trunk, this was not possible. The Cobra catheter was then exchanged over a standard Bentson wire for a Mickelson catheter. With the [facility] catheter at the celiac artery origin, microcatheter system was advanced. Initially we used a 150 cm high-flow Renegade catheter and a fathom wire. Microcatheter was advanced to the common hepatic artery. Angiogram was performed. Catheter was then advanced into the right gastric artery. Angiogram was performed. Catheter was then advanced into the proper hepatic artery. Angiogram was performed. Catheter was advanced into the right hepatic artery. Angiogram was performed. Catheter was advanced into the cystic artery. Angiogram was performed. Catheter was used to select the segmental arteries of the right hepatic lobe, with angiogram performed within each artery. During the angiographic evaluation of the right hepatic segmental arteries, vascular shunts were identified, which pose a potential problem for increasing the a patent pulmonary shunt fraction and might limit any future Y 90 treatment. Given, we placed the microcatheter into segmental artery, right hepatic lobe, confirmed the vessels with likely the most degree of shunting, and embolized with 500-700 micron inter embospheres to slow the flow through the shunt. 2/3 of the bottle of 500-700 embosphere used for the for segmental artery. Repeat  angiogram was performed. Catheter was withdrawn, and additional segmental arteries were interrogated of the right lobe and segment 4. Catheter was again withdrawn and used to select the left hepatic artery. Angiogram was performed. During the angiographic valuation of the left hepatic artery segmental arteries, additional vascular shunts were identified. Again these post potential problem for increasing the pulmonary shunt fraction and might limit future Y 90 treatment. Given, EMBO sphere embolization was performed to slow the flow through the vascular shunts, at this site using 300-500 micron embospheres. Repeat angiogram was performed The high-flow catheter was then removed. A new 0.21 Lumen microcatheter was then used to select the right gastric artery. Coil embolization was performed with a single 3 mm x 10 cm coil. The microcatheter was then used to select the gastroduodenal artery. Angiogram was performed. Given that flow was completely reversed within the gastroduodenal artery, with strong single direction flow towards the liver, this artery was not prophylactically embolized. The microcatheter was withdrawn to  just proximal to the gastroduodenal artery, and the MAA dose was administered. All catheters and wires were withdrawn and disposed, and Exoseal was deployed at the right common femoral artery access. Patient tolerated the procedure well and remained hemodynamically stable throughout. No complications were encountered and no significant blood loss. IMPRESSION: Status post ultrasound guided access right common femoral artery for mesenteric angiogram, prophylactic micro sphere embolization of tumor vascular shunts, coil embolization of the right gastric artery, MAA administration, and deployment of Exoseal for hemostasis. Signed, Dulcy Fanny. Dellia Nims, RPVI Vascular and Interventional Radiology Specialists [facility] Radiology Electronically Signed   By: Corrie Mckusick D.O.   On: 06/09/2018 12:38   Ir US  Guide Vasc Access Right  Result Date: 06/09/2018 INDICATION: 84 year old male with a history of hepatocellular carcinoma, presenting today for pre Y 90 mapping EXAM: ULTRASOUND GUIDED ACCESS RIGHT COMMON FEMORAL ARTERY MESENTERIC ANGIOGRAM COIL EMBOLIZATION OF RIGHT GASTRIC ARTERY AS A POTENTIAL SOURCE FOR NON TARGET EMBOLIZATION SUPER SELECTIVE TUMOR EMBOLIZATION TO TREAT VASCULAR SHUNTS WHICH COULD CONTRIBUTE TO INCREASED HEPATIC OF PULMONARY SHUNTING DEPLOYMENT OF EXOSEAL FOR HEMOSTASIS MEDICATIONS: None ANESTHESIA/SEDATION: Moderate (conscious) sedation was employed during this procedure. A total of Versed 4.0 mg and Fentanyl 100 mcg was administered intravenously. Moderate Sedation Time: 106 minutes. The patient's level of consciousness and vital signs were monitored continuously by radiology nursing throughout the procedure under my direct supervision. CONTRAST:  187 cc Omnipaque 300 FLUOROSCOPY TIME:  Fluoroscopy Time: 22 minutes 12 seconds (3683 mGy). COMPLICATIONS: None PROCEDURE: Informed consent was obtained from the patient following explanation of the procedure, risks, benefits and alternatives. The patient understands, agrees and consents for the procedure. All questions were addressed. A time out was performed prior to the initiation of the procedure. Maximal barrier sterile technique utilized including caps, mask, sterile gowns, sterile gloves, large sterile drape, hand hygiene, and Betadine prep. Ultrasound survey of the right inguinal region was performed with images stored and sent to PACs, confirming patency of the vessel. A micropuncture needle was used access the right common femoral artery under ultrasound. With excellent arterial blood flow returned, and an .018 micro wire was passed through the needle, observed enter the abdominal aorta under fluoroscopy. The needle was removed, and a micropuncture sheath was placed over the wire. The inner dilator and wire were removed, and an 035 Bentson  wire was advanced under fluoroscopy into the abdominal aorta. The sheath was removed and a standard 5 [place] vascular sheath was placed. The dilator was removed and the sheath was flushed. Cobra catheter was advanced over the Bentson wire to the abdominal aorta. Catheter was used to select the celiac artery. Angiogram was performed. Catheter was withdrawn to the superior mesenteric artery origin. Angiogram was performed. Catheter was then used to select the celiac artery, and a Glidewire was advanced in attempt to place the base catheter within the common hepatic artery. Given the angle of the celiac artery trunk, this was not possible. The Cobra catheter was then exchanged over a standard Bentson wire for a Mickelson catheter. With the [facility] catheter at the celiac artery origin, microcatheter system was advanced. Initially we used a 150 cm high-flow Renegade catheter and a fathom wire. Microcatheter was advanced to the common hepatic artery. Angiogram was performed. Catheter was then advanced into the right gastric artery. Angiogram was performed. Catheter was then advanced into the proper hepatic artery. Angiogram was performed. Catheter was advanced into the right hepatic artery. Angiogram was performed. Catheter was advanced into the cystic artery. Angiogram  was performed. Catheter was used to select the segmental arteries of the right hepatic lobe, with angiogram performed within each artery. During the angiographic evaluation of the right hepatic segmental arteries, vascular shunts were identified, which pose a potential problem for increasing the a patent pulmonary shunt fraction and might limit any future Y 90 treatment. Given, we placed the microcatheter into segmental artery, right hepatic lobe, confirmed the vessels with likely the most degree of shunting, and embolized with 500-700 micron inter embospheres to slow the flow through the shunt. 2/3 of the bottle of 500-700 embosphere used for the for  segmental artery. Repeat angiogram was performed. Catheter was withdrawn, and additional segmental arteries were interrogated of the right lobe and segment 4. Catheter was again withdrawn and used to select the left hepatic artery. Angiogram was performed. During the angiographic valuation of the left hepatic artery segmental arteries, additional vascular shunts were identified. Again these post potential problem for increasing the pulmonary shunt fraction and might limit future Y 90 treatment. Given, EMBO sphere embolization was performed to slow the flow through the vascular shunts, at this site using 300-500 micron embospheres. Repeat angiogram was performed The high-flow catheter was then removed. A new 0.21 Lumen microcatheter was then used to select the right gastric artery. Coil embolization was performed with a single 3 mm x 10 cm coil. The microcatheter was then used to select the gastroduodenal artery. Angiogram was performed. Given that flow was completely reversed within the gastroduodenal artery, with strong single direction flow towards the liver, this artery was not prophylactically embolized. The microcatheter was withdrawn to just proximal to the gastroduodenal artery, and the MAA dose was administered. All catheters and wires were withdrawn and disposed, and Exoseal was deployed at the right common femoral artery access. Patient tolerated the procedure well and remained hemodynamically stable throughout. No complications were encountered and no significant blood loss. IMPRESSION: Status post ultrasound guided access right common femoral artery for mesenteric angiogram, prophylactic micro sphere embolization of tumor vascular shunts, coil embolization of the right gastric artery, MAA administration, and deployment of Exoseal for hemostasis. Signed, Dulcy Fanny. Dellia Nims, RPVI Vascular and Interventional Radiology Specialists [facility] Radiology Electronically Signed   By: Corrie Mckusick D.O.   On:  06/09/2018 12:38   Ir Embo Arterial Not [place] Guide Roadmapping  Result Date: 06/09/2018 INDICATION: 84 year old male with a history of hepatocellular carcinoma, presenting today for pre Y 90 mapping EXAM: ULTRASOUND GUIDED ACCESS RIGHT COMMON FEMORAL ARTERY MESENTERIC ANGIOGRAM COIL EMBOLIZATION OF RIGHT GASTRIC ARTERY AS A POTENTIAL SOURCE FOR NON TARGET EMBOLIZATION SUPER SELECTIVE TUMOR EMBOLIZATION TO TREAT VASCULAR SHUNTS WHICH COULD CONTRIBUTE TO INCREASED HEPATIC OF PULMONARY SHUNTING DEPLOYMENT OF EXOSEAL FOR HEMOSTASIS MEDICATIONS: None ANESTHESIA/SEDATION: Moderate (conscious) sedation was employed during this procedure. A total of Versed 4.0 mg and Fentanyl 100 mcg was administered intravenously. Moderate Sedation Time: 106 minutes. The patient's level of consciousness and vital signs were monitored continuously by radiology nursing throughout the procedure under my direct supervision. CONTRAST:  187 cc Omnipaque 300 FLUOROSCOPY TIME:  Fluoroscopy Time: 22 minutes 12 seconds (3683 mGy). COMPLICATIONS: None PROCEDURE: Informed consent was obtained from the patient following explanation of the procedure, risks, benefits and alternatives. The patient understands, agrees and consents for the procedure. All questions were addressed. A time out was performed prior to the initiation of the procedure. Maximal barrier sterile technique utilized including caps, mask, sterile gowns, sterile gloves, large sterile drape, hand hygiene, and Betadine prep. Ultrasound survey of the  right inguinal region was performed with images stored and sent to PACs, confirming patency of the vessel. A micropuncture needle was used access the right common femoral artery under ultrasound. With excellent arterial blood flow returned, and an .018 micro wire was passed through the needle, observed enter the abdominal aorta under fluoroscopy. The needle was removed, and a micropuncture sheath was placed over the wire. The  inner dilator and wire were removed, and an 035 Bentson wire was advanced under fluoroscopy into the abdominal aorta. The sheath was removed and a standard 5 [place] vascular sheath was placed. The dilator was removed and the sheath was flushed. Cobra catheter was advanced over the Bentson wire to the abdominal aorta. Catheter was used to select the celiac artery. Angiogram was performed. Catheter was withdrawn to the superior mesenteric artery origin. Angiogram was performed. Catheter was then used to select the celiac artery, and a Glidewire was advanced in attempt to place the base catheter within the common hepatic artery. Given the angle of the celiac artery trunk, this was not possible. The Cobra catheter was then exchanged over a standard Bentson wire for a Mickelson catheter. With the [facility] catheter at the celiac artery origin, microcatheter system was advanced. Initially we used a 150 cm high-flow Renegade catheter and a fathom wire. Microcatheter was advanced to the common hepatic artery. Angiogram was performed. Catheter was then advanced into the right gastric artery. Angiogram was performed. Catheter was then advanced into the proper hepatic artery. Angiogram was performed. Catheter was advanced into the right hepatic artery. Angiogram was performed. Catheter was advanced into the cystic artery. Angiogram was performed. Catheter was used to select the segmental arteries of the right hepatic lobe, with angiogram performed within each artery. During the angiographic evaluation of the right hepatic segmental arteries, vascular shunts were identified, which pose a potential problem for increasing the a patent pulmonary shunt fraction and might limit any future Y 90 treatment. Given, we placed the microcatheter into segmental artery, right hepatic lobe, confirmed the vessels with likely the most degree of shunting, and embolized with 500-700 micron inter embospheres to slow the flow through the shunt. 2/3  of the bottle of 500-700 embosphere used for the for segmental artery. Repeat angiogram was performed. Catheter was withdrawn, and additional segmental arteries were interrogated of the right lobe and segment 4. Catheter was again withdrawn and used to select the left hepatic artery. Angiogram was performed. During the angiographic valuation of the left hepatic artery segmental arteries, additional vascular shunts were identified. Again these post potential problem for increasing the pulmonary shunt fraction and might limit future Y 90 treatment. Given, EMBO sphere embolization was performed to slow the flow through the vascular shunts, at this site using 300-500 micron embospheres. Repeat angiogram was performed The high-flow catheter was then removed. A new 0.21 Lumen microcatheter was then used to select the right gastric artery. Coil embolization was performed with a single 3 mm x 10 cm coil. The microcatheter was then used to select the gastroduodenal artery. Angiogram was performed. Given that flow was completely reversed within the gastroduodenal artery, with strong single direction flow towards the liver, this artery was not prophylactically embolized. The microcatheter was withdrawn to just proximal to the gastroduodenal artery, and the MAA dose was administered. All catheters and wires were withdrawn and disposed, and Exoseal was deployed at the right common femoral artery access. Patient tolerated the procedure well and remained hemodynamically stable throughout. No complications were encountered and no significant blood  loss. IMPRESSION: Status post ultrasound guided access right common femoral artery for mesenteric angiogram, prophylactic micro sphere embolization of tumor vascular shunts, coil embolization of the right gastric artery, MAA administration, and deployment of Exoseal for hemostasis. Signed, Dulcy Fanny. Dellia Nims, RPVI [facility] Radiology  Electronically Signed   By: Corrie Mckusick D.O.   On: 06/09/2018 12:38   Ir Embo Tumor Organ Ischemia Infarct Inc Guide Roadmapping  Result Date: 06/09/2018 INDICATION: 84 year old male with a history of hepatocellular carcinoma, presenting today for pre Y 90 mapping EXAM: ULTRASOUND GUIDED ACCESS RIGHT COMMON FEMORAL ARTERY MESENTERIC ANGIOGRAM COIL EMBOLIZATION OF RIGHT GASTRIC ARTERY AS A POTENTIAL SOURCE FOR NON TARGET EMBOLIZATION SUPER SELECTIVE TUMOR EMBOLIZATION TO TREAT VASCULAR SHUNTS WHICH COULD CONTRIBUTE TO INCREASED HEPATIC OF PULMONARY SHUNTING DEPLOYMENT OF EXOSEAL FOR HEMOSTASIS MEDICATIONS: None ANESTHESIA/SEDATION: Moderate (conscious) sedation was employed during this procedure. A total of Versed 4.0 mg and Fentanyl 100 mcg was administered intravenously. Moderate Sedation Time: 106 minutes. The patient's level of consciousness and vital signs were monitored continuously by radiology nursing throughout the procedure under my direct supervision. CONTRAST:  187 cc Omnipaque 300 FLUOROSCOPY TIME:  Fluoroscopy Time: 22 minutes 12 seconds (3683 mGy). COMPLICATIONS: None PROCEDURE: Informed consent was obtained from the patient following explanation of the procedure, risks, benefits and alternatives. The patient understands, agrees and consents for the procedure. All questions were addressed. A time out was performed prior to the initiation of the procedure. Maximal barrier sterile technique utilized including caps, mask, sterile gowns, sterile gloves, large sterile drape, hand hygiene, and Betadine prep. Ultrasound survey of the right inguinal region was performed with images stored and sent to PACs, confirming patency of the vessel. A micropuncture needle was used access the right common femoral artery under ultrasound. With excellent arterial blood flow returned, and an .018 micro wire was passed through the needle, observed enter the abdominal aorta under fluoroscopy. The needle was removed, and  a micropuncture sheath was placed over the wire. The inner dilator and wire were removed, and an 035 Bentson wire was advanced under fluoroscopy into the abdominal aorta. The sheath was removed and a standard 5 [place] vascular sheath was placed. The dilator was removed and the sheath was flushed. Cobra catheter was advanced over the Bentson wire to the abdominal aorta. Catheter was used to select the celiac artery. Angiogram was performed. Catheter was withdrawn to the superior mesenteric artery origin. Angiogram was performed. Catheter was then used to select the celiac artery, and a Glidewire was advanced in attempt to place the base catheter within the common hepatic artery. Given the angle of the celiac artery trunk, this was not possible. The Cobra catheter was then exchanged over a standard Bentson wire for a Mickelson catheter. With the [facility] catheter at the celiac artery origin, microcatheter system was advanced. Initially we used a 150 cm high-flow Renegade catheter and a fathom wire. Microcatheter was advanced to the common hepatic artery. Angiogram was performed. Catheter was then advanced into the right gastric artery. Angiogram was performed. Catheter was then advanced into the proper hepatic artery. Angiogram was performed. Catheter was advanced into the right hepatic artery. Angiogram was performed. Catheter was advanced into the cystic artery. Angiogram was performed. Catheter was used to select the segmental arteries of the right hepatic lobe, with angiogram performed within each artery. During the angiographic evaluation of the right hepatic segmental arteries, vascular shunts were identified, which pose a potential problem for increasing the a patent pulmonary  shunt fraction and might limit any future Y 90 treatment. Given, we placed the microcatheter into segmental artery, right hepatic lobe, confirmed the vessels with likely the most degree of shunting, and embolized with 500-700 micron  inter embospheres to slow the flow through the shunt. 2/3 of the bottle of 500-700 embosphere used for the for segmental artery. Repeat angiogram was performed. Catheter was withdrawn, and additional segmental arteries were interrogated of the right lobe and segment 4. Catheter was again withdrawn and used to select the left hepatic artery. Angiogram was performed. During the angiographic valuation of the left hepatic artery segmental arteries, additional vascular shunts were identified. Again these post potential problem for increasing the pulmonary shunt fraction and might limit future Y 90 treatment. Given, EMBO sphere embolization was performed to slow the flow through the vascular shunts, at this site using 300-500 micron embospheres. Repeat angiogram was performed The high-flow catheter was then removed. A new 0.21 Lumen microcatheter was then used to select the right gastric artery. Coil embolization was performed with a single 3 mm x 10 cm coil. The microcatheter was then used to select the gastroduodenal artery. Angiogram was performed. Given that flow was completely reversed within the gastroduodenal artery, with strong single direction flow towards the liver, this artery was not prophylactically embolized. The microcatheter was withdrawn to just proximal to the gastroduodenal artery, and the MAA dose was administered. All catheters and wires were withdrawn and disposed, and Exoseal was deployed at the right common femoral artery access. Patient tolerated the procedure well and remained hemodynamically stable throughout. No complications were encountered and no significant blood loss. IMPRESSION: Status post ultrasound guided access right common femoral artery for mesenteric angiogram, prophylactic micro sphere embolization of tumor vascular shunts, coil embolization of the right gastric artery, MAA administration, and deployment of Exoseal for hemostasis. Signed, Dulcy Fanny. Dellia Nims, RPVI Vascular and  Interventional Radiology Specialists [facility] Radiology Electronically Signed   By: Corrie Mckusick D.O.   On: 06/09/2018 12:38   Ir Radiologist Eval & Mgmt  Result Date: 05/17/2018 Please refer to notes tab for details about interventional procedure. (Op Note)  Nm Fusion  Result Date: 06/09/2018 : CLINICAL DATA: Hepatocellular carcinoma. EXAM: NUCLEAR MEDICINE LIVER SCAN TECHNIQUE: Abdominal images were obtained in multiple projections after intrahepatic arterial injection of radiopharmaceutical. SPECT imaging was performed. Lung shunt calculation was performed. RADIOPHARMACEUTICALS: 5.55millicurie MAA TECHNETIUM TO 8M ALBUMIN AGGREGATED COMPARISON: Angiogram 06/09/2018, CT a abdomen 08/04/2016, CT abdomen 04/04/2018 FINDINGS: The injected microaggregated albumin localizes within the liver. No evidence of activity within the stomach, duodenum, or bowel. Majority activity is peripheral around the tumor. Calculated shunt fraction to the lungs equals 37%. IMPRESSION: 1. No significant extrahepatic radiotracer activity following intrahepatic arterial injection of MAA. 2. Lung shunt fraction equals 37% Findings conveyed toJAIME WAGNER on 06/09/2018 at13:27 Electronically Signed   By: Suzy Bouchard M.D.   On: 06/09/2018 15:35    Labs:  CBC: Recent Labs    05/03/18 1558 06/09/18 0813  WBC 5.8 5.6  HGB 11.1* 11.5*  HCT 34.0* 36.0*  PLT 191 244    COAGS: Recent Labs    05/03/18 1558 06/09/18 0813  INR 0.99 0.91  APTT  --  30    BMP: Recent Labs    05/03/18 1558 06/09/18 0813  NA 140 144  K 4.2 4.5  CL 103 105  CO2 29 26  GLUCOSE 93 89  BUN 18 20  CALCIUM 9.4 9.5  CREATININE 0.89 0.83  GFRNONAA >60 >60  GFRAA >60 >60    LIVER FUNCTION TESTS: Recent Labs    05/03/18 1558 06/09/18 0813  BILITOT 0.7 0.7  AST 91* 79*  ALT 60* 42  ALKPHOS 131* 126  PROT 7.2 7.3  ALBUMIN 3.9 3.9    TUMOR MARKERS: No results for input(s): AFPTM, CEA, CA199, CHROMGRNA in the last 8760  hours.  Assessment and Plan:  My impression is that the patient is experiencing  post embolization syndrome, probably reflecting a degree of tumor ischemia/necrosis as a result of the embosphere transarterial embolization component of the procedure. We discussed the reasoning and components of his recent procedure.  We discussed the post embolization syndrome and its pathophysiology.  We discussed that this represents a self-limited side effect of tumor embolization.  We discussed that he may have similar or variable response to Y 90 embolization, which results in tumor necrosis using a different pathway of direct radiation damage to the tumor.  He is contemplating whether to proceed with treatment as he is pain-averse.  We discussed the natural history of hepatocellular carcinoma with possibility of ascites, metastatic disease, capsular distention and pain,  liver failure and clinical manifestations of those possibilities.  We discussed the hope for prolongation of life and diminution of symptoms with Y 90 radioembolization.  We discussed the variable response and course that events may take for any given patient.  He seemed to understand, and continues to struggle with how to proceed.  I emphasized that he should take the time that he feels like he needs to make an appropriate decision, and consult with Dr. Earleen Newport or others as needed.  In the meantime, I wrote him a prescription for Zofran 4 mg #30, instructions 1-2 p.o. every 6 hours as needed nausea.  Thank you for this interesting consult.  I greatly enjoyed meeting Goebel Hellums and look forward to participating in their care.  A copy of this report was sent to the requesting provider on this date.  Electronically Signed: Rickard Rhymes 06/13/2018, 11:33 AM   I spent a total of    25 Minutes in face to face in clinical consultation, greater than 50% of which was counseling/coordinating care for hepatocellular carcinoma.

## 2018-06-20 ENCOUNTER — Inpatient Hospital Stay: Payer: Medicare Other | Admitting: Nutrition

## 2018-06-20 ENCOUNTER — Telehealth: Payer: Self-pay | Admitting: Oncology

## 2018-06-20 ENCOUNTER — Inpatient Hospital Stay: Payer: Medicare Other | Attending: Nurse Practitioner | Admitting: Oncology

## 2018-06-20 VITALS — BP 128/43 | HR 74 | Temp 98.5°F | Resp 14 | Ht 70.5 in | Wt 170.2 lb

## 2018-06-20 DIAGNOSIS — R61 Generalized hyperhidrosis: Secondary | ICD-10-CM | POA: Insufficient documentation

## 2018-06-20 DIAGNOSIS — C220 Liver cell carcinoma: Secondary | ICD-10-CM | POA: Diagnosis present

## 2018-06-20 NOTE — Progress Notes (Signed)
[place] OFFICE PROGRESS NOTE   Diagnosis: Hepatocellular carcinoma  INTERVAL HISTORY:   Kenneth Woodward returns for a scheduled visit.  He was referred to interventional radiology to consider radioembolization of the liver.  He underwent a prophylactic microsphere embolization of tubular vascular shunts in the right and left liver, and coil embolization of the right gastric artery 06/09/2018. He developed fatigue, nausea, and anorexia following this procedure.  He was evaluated by Dr. Vernard Gambles on 06/13/2018 and was felt to be experiencing post embolization syndrome.  His symptoms have improved over the past several days.  He has been eating more this week.  He is here today with his wife.  They report Kenneth Woodward has decided against the radial embolization procedure. Objective:  Vital signs in last 24 hours:  Blood pressure (!) 128/43, pulse 74, temperature 98.5 F (36.9 C), temperature source Oral, resp. rate 14, height 5' 10.5" (1.791 m), weight 170 lb 3.2 oz (77.2 kg), SpO2 98 %.    Resp: Lungs clear bilaterally Cardio: Distant heart sounds, regular rate and rhythm GI: The liver is palpable in the mid upper abdomen, the abdomen is mildly distended Vascular: Trace ankle edema bilaterally Neuro: Alert and oriented    Lab Results:  Lab Results  Component Value Date   WBC 5.6 06/09/2018   HGB 11.5 (L) 06/09/2018   HCT 36.0 (L) 06/09/2018   MCV 88.5 06/09/2018   PLT 244 06/09/2018   NEUTROABS 3.4 06/09/2018    CMP  Lab Results  Component Value Date   NA 144 06/09/2018   K 4.5 06/09/2018   CL 105 06/09/2018   CO2 26 06/09/2018   GLUCOSE 89 06/09/2018   BUN 20 06/09/2018   CREATININE 0.83 06/09/2018   CALCIUM 9.5 06/09/2018   PROT 7.3 06/09/2018   ALBUMIN 3.9 06/09/2018   AST 79 (H) 06/09/2018   ALT 42 06/09/2018   ALKPHOS 126 06/09/2018   BILITOT 0.7 06/09/2018   GFRNONAA >60 06/09/2018   GFRAA >60 06/09/2018    No results found for: CEA1  Lab  Results  Component Value Date   INR 0.91 06/09/2018     Medications: I have reviewed the patient's current medications.   Assessment/Plan: 1. Hepatocellular carcinoma  CT scan of the abdomen/pelvis 08/04/2016-multiple left hepatic lobe lesions. The largest lesion measured 8.3 x 6.9 cm and was centered segment 4 of the liver. Smaller lesion exophytic from the inferior anterior margin of the hepatic lobe measuring 3.5 x 3.2 cm. Contiguous 3.6 cm exophytic lesion along the falciform ligament. Cystic lesion in the right hepatic lobe without enhancement. Additional cystic lesion in the central right hepatic lobe with enhancing septation measuring 19 mm. Multiple additional cystic lesions noted. There was a soft tissue implant in the subcutaneous tissue of the right abdominal wall measuring 2.6 cm.   [facility]very large mass spanning the right and left lobes of the liver measuring approximately 15.2 x 10.7 x 14.8 cm. Additional smaller masses noted adjacent to the primary lesion, one lateral to the lesion measuring 3.5 x 3 cm and one lesion possibly contiguous with the primary mass measuring 6.2 x 3.8 cm. All of these had increased in size when compared to the previous examination. There was a cystic lesion in the lateral right lobe of the liver with multiple septations and some calcifications within the septations. This lesion was not significantly changed in size from the prior examination. There was a soft tissue mass in the subcutaneous tissues of the anterior abdominal  wall to the right of midline just below the umbilicus, not significantly changed from the prior.  CT-guided liver biopsy ([facility]) 04/27/2018-hepatocellular carcinoma  05/03/2018 AFP 73  Chest CT 05/10/2018- no evidence of metastatic disease in the chest.  Multifocal hepatic masses, grossly unchanged.  06/09/2018-angiogram and prophylactic microsphere embolization of tubular vascular  shunts in the right and left liver and coil embolization of the right gastric artery  Post embolization syndrome-improving   2. Night sweats likely secondary to #1    Disposition: Kenneth Woodward has hepatocellular carcinoma.  He is referred to consider radioembolization.  He underwent an arteriogram and prophylactic embolization procedure.  This was complicated by malaise, nausea, and anorexia.  These symptoms are improving.  He has decided against radioembolization.  We discussed systemic treatment options.  He stated that he wishes to focus on quality of life and prefers supportive care.  We discussed hospice and palliative care referrals.  He would like to hold on this for now.  He will return for an office visit in 2-3 weeks.  He will contact us for increased abdominal distention and we will arrange for a therapeutic paracentesis.  25 minutes were spent with the patient today.  The majority of the time was used for counseling and coordination of care.  Betsy Coder, MD  06/20/2018  8:17 AM

## 2018-06-20 NOTE — Progress Notes (Signed)
84 year old male diagnosed with [place].  He is a patient of Dr. Benay Spice.  Past medical history is not significant.  Medications were reviewed.  Labs were reviewed.  Height: 5 feet 10 inches. Weight: 170.2 pounds September 17. Usual body weight: 175 pounds per patient. BMI: 24.08.  Patient reports he has chosen not to have chemotherapy or radiation therapy.   He is focusing on quality of life. Wife reports she provides patient with lots of healthy calories and snacks. She reports she is very interested in nutrition and feels she is quite knowledgeable. She has been making smoothies with ultra protein plus powder from [place] [facility]. Patient denies nausea, vomiting, constipation, and diarrhea.  No nutrition diagnosis.  Encouraged patient and wife to continue strategies for healthy plant-based diet with adequate calories and protein.  Provided contact information.  No follow-up required.  **Disclaimer: This note was dictated with voice recognition software. Similar sounding words can inadvertently be transcribed and this note may contain transcription errors which may not have been corrected upon publication of note.**

## 2018-06-20 NOTE — Progress Notes (Signed)
Mrs. Pinn e-mailed to say that pt has decided he would like palliative care at this time. I called patient back and left VM offering support. Message sent to Dr. Benay Spice.

## 2018-06-20 NOTE — Telephone Encounter (Signed)
Appts scheduled avs/calendar printed per 9/17 los °

## 2018-06-22 ENCOUNTER — Ambulatory Visit (HOSPITAL_COMMUNITY): Payer: Medicare Other

## 2018-06-22 ENCOUNTER — Encounter (HOSPITAL_COMMUNITY): Payer: Medicare Other

## 2018-06-22 ENCOUNTER — Other Ambulatory Visit (HOSPITAL_COMMUNITY): Payer: Medicare Other

## 2018-06-22 ENCOUNTER — Other Ambulatory Visit: Payer: Self-pay | Admitting: Nurse Practitioner

## 2018-06-22 DIAGNOSIS — C220 Liver cell carcinoma: Secondary | ICD-10-CM

## 2018-06-23 ENCOUNTER — Telehealth: Payer: Self-pay | Admitting: Emergency Medicine

## 2018-06-23 NOTE — Telephone Encounter (Signed)
Patients daughter called stating the patient had fluid on his abdomen and was there anything that could be done other that a paracentesis since that would be their last resort. Explained to he per Marlynn Perking that she is not aware of any diuretics or other options to help with his ascites . She will confirm with Dr.Sherrill. Pt daughter verbalized understanding of this.

## 2018-06-26 ENCOUNTER — Telehealth: Payer: Self-pay | Admitting: Emergency Medicine

## 2018-06-26 ENCOUNTER — Telehealth: Payer: Self-pay

## 2018-06-26 NOTE — Telephone Encounter (Signed)
VM left to introduce Palliative Care and to offer to schedule visit with NP

## 2018-06-26 NOTE — Telephone Encounter (Addendum)
Spoke to Mrs.Recendiz regarding this note. She states she will speak to the patient and they will call back with a answer as to if they want the GI referral or not. She states his abdomin is still slightly distended but he is not uncomfortable. Will follow up.   ----- Message from Owens Shark, NP sent at 06/26/2018  8:35 AM EDT ----- Please let him know Dr. Benay Spice recommends a referral to gastroenterology for cirrhosis management.  Does he have a gastroenterologist?

## 2018-06-28 ENCOUNTER — Telehealth: Payer: Self-pay

## 2018-06-28 NOTE — Telephone Encounter (Signed)
Phone call received from patient's wife to schedule visit with Palliative Care. Wife shared family history and gave verbal consent for services. Scheduled for Wednesday 07/05/18 @ 11 am.

## 2018-07-03 ENCOUNTER — Telehealth: Payer: Self-pay | Admitting: *Deleted

## 2018-07-03 NOTE — Telephone Encounter (Signed)
Patient's wife called concerned about abd swelling. Patient has

## 2018-07-05 ENCOUNTER — Other Ambulatory Visit: Payer: Medicare Other | Admitting: Nurse Practitioner

## 2018-07-05 ENCOUNTER — Encounter: Payer: Self-pay | Admitting: Nurse Practitioner

## 2018-07-05 DIAGNOSIS — R180 Malignant ascites: Secondary | ICD-10-CM

## 2018-07-05 DIAGNOSIS — Z515 Encounter for palliative care: Secondary | ICD-10-CM

## 2018-07-05 DIAGNOSIS — N401 Enlarged prostate with lower urinary tract symptoms: Secondary | ICD-10-CM

## 2018-07-05 DIAGNOSIS — R350 Frequency of micturition: Secondary | ICD-10-CM

## 2018-07-05 DIAGNOSIS — R630 Anorexia: Secondary | ICD-10-CM

## 2018-07-05 DIAGNOSIS — R530 Neoplastic (malignant) related fatigue: Secondary | ICD-10-CM

## 2018-07-05 NOTE — Progress Notes (Signed)
PALLIATIVE CARE CONSULT VISIT   PATIENT NAME: Kenneth Woodward DOB: 02-18-1928 MRN: [id number]  PRIMARY CARE PROVIDER:   Chesley Noon, MD  REFERRING PROVIDER:  Chesley Noon, MD [name], [place] [address]  RESPONSIBLE PARTY:   [name] (spouse)   HISTORY OF PRESENT ILLNESS:  Kenneth Woodward is a 84 y.o. year old male with multiple medical problems including multi-focal hepatocellular carcinoma (not surgical candidate) s/p prophylactic emolization and BPH. Palliative Care was asked to help with symptom management and to address goals of care.   ASSESSMENT/RECOMMENDATIONS and PLAN:  Hepatocellular carcinoma (multiple lesions noted on CT scan in Nov 2017; patient declined further evaluation) Repeat CT scan 04/04/2018 with significant enlarging of lesions; Bx done 04/27/2018; path reported hepatocellular carcinoma -Fatigue, nausea, anorexia, night sweats, dyspnea on exertion s/p procedure (now improving) -patient s/p prophylactic embolization on 06/13/18 followed by two weeks of fatigue, nausea,anorexia etc -symptoms have improved and patients oral intake is almost back to baseline (eats 3 meals a day) and takes in some liquid protein supplements; minimal weight loss initially; now stable -Patient has returned to work on very part-time basis -patient does not feel that he has received enough informations regarding possible future Y-90 procedure and at this time is reluctant 2/2 possible side effects; risk vs benefits; he understands this is not curable and will only prolong his life by a few months. -encouraged patient and wife to contact Dr. Benay Spice to see if they can get more information/education and to do some online research   Abdominal Ascites (most likely multifactorial) due to liver cancer and hypoproteinemia  -patient describes mild feeling of fullness; no pain or shortness of breath -educated patient and wife regarding possible etiologies of ascites and  possible future paracentesis or long-term tunneled catheter to drain if needed. For now to report significant increase in abdominal girth; pain; shortness of breath, fever etc.  Urinary Frequency with incomplete bladder emptying  -Patient and wife educated on signs of BPH  -patient previously prescribed tamsulosin by urologist but only took few doses "because it didn't help"; patient has f/u appt on november the 11th -advised patient to restart medication and "give it some time"; if effective should see by next urologist appointment; if not urologist can make future decision that 1st line treatment was not effective.  ACP -patient understands he has incurable disease process and wants to focus on comfort and quality of life -will address DNR/MOST at future visit   I spent 120 minutes providing this consultation,  from 11:00 to 12:00. More than 50% of the time in this consultation was spent coordinating communication.    CODE STATUS: TBD PPS: 80% HOSPICE ELIGIBILITY/DIAGNOSIS: TBD  PAST MEDICAL HISTORY: No past medical history on file.  SOCIAL HX:  Social History   Tobacco Use  . Smoking status: Former Smoker    Last attempt to quit: 10/04/1978    Years since quitting: 39.7  . Smokeless tobacco: Never Used  Substance Use Topics  . Alcohol use: Yes    Alcohol/week: 3.0 standard drinks    Types: 3 Glasses of wine per week    ALLERGIES:  Allergies  Allergen Reactions  . Adhesive [Tape] Other (See Comments)    Band-aids irritate skin. Ok to use gauze with paper tape     PERTINENT MEDICATIONS:  No outpatient encounter medications on file as of 07/05/2018.   No facility-administered encounter medications on file as of 07/05/2018.     PHYSICAL EXAM:   General:  Well-nourished, well-developed; well groomed in NAD Cardiovascular: regular rate and rhythm Pulmonary: clear ant fields Abdomen: softly distended; no pain  GU: reports no suprapubic tenderness Extremities: no edema, no joint  deformities Skin: no rashes of exposed skin Neurological: A&Ox4; CN II-XII grossly intact; walks without assistance; no focal deficits noted  Carolena Fairbank G [place], NP

## 2018-07-06 ENCOUNTER — Encounter: Payer: Self-pay | Admitting: Nurse Practitioner

## 2018-07-07 ENCOUNTER — Ambulatory Visit: Payer: Medicare Other | Admitting: Oncology

## 2018-07-20 ENCOUNTER — Inpatient Hospital Stay (HOSPITAL_COMMUNITY): Admission: RE | Admit: 2018-07-20 | Payer: Medicare Other | Source: Ambulatory Visit

## 2018-07-20 ENCOUNTER — Ambulatory Visit (HOSPITAL_COMMUNITY): Payer: Medicare Other

## 2018-07-20 ENCOUNTER — Encounter (HOSPITAL_COMMUNITY): Payer: Self-pay

## 2018-07-20 ENCOUNTER — Other Ambulatory Visit (HOSPITAL_COMMUNITY): Payer: Medicare Other

## 2018-08-21 ENCOUNTER — Telehealth: Payer: Self-pay

## 2018-08-21 NOTE — Telephone Encounter (Signed)
Spoke with patient's wife, Vaughan Basta, to schedule visit with Palliative care. Visit scheduled for 08/23/18

## 2018-08-21 NOTE — Telephone Encounter (Signed)
Received phone call patient's wife, Vaughan Basta, requesting a return call back. Return call placed to patient's wife to offer to schedule a visit with NP. VM left

## 2018-08-23 ENCOUNTER — Other Ambulatory Visit: Payer: Medicare Other | Admitting: Nurse Practitioner

## 2018-08-23 DIAGNOSIS — Z515 Encounter for palliative care: Secondary | ICD-10-CM

## 2018-08-23 DIAGNOSIS — N401 Enlarged prostate with lower urinary tract symptoms: Secondary | ICD-10-CM

## 2018-08-23 DIAGNOSIS — R350 Frequency of micturition: Secondary | ICD-10-CM

## 2018-08-23 DIAGNOSIS — R630 Anorexia: Secondary | ICD-10-CM

## 2018-08-23 DIAGNOSIS — R530 Neoplastic (malignant) related fatigue: Secondary | ICD-10-CM

## 2018-08-23 DIAGNOSIS — R180 Malignant ascites: Secondary | ICD-10-CM

## 2018-08-24 ENCOUNTER — Encounter: Payer: Self-pay | Admitting: Nurse Practitioner

## 2018-08-24 NOTE — Progress Notes (Signed)
    PALLIATIVE CARE CONSULT VISIT   PATIENT NAME: Kenneth Woodward DOB: 06/15/1928 MRN: [id number]  PRIMARY CARE PROVIDER:   Chesley Noon, MD  REFERRING PROVIDER:  Chesley Noon, MD [address], [place] [address]  RESPONSIBLE PARTY:   Jamen Loiseau (spouse)   HISTORY OF PRESENT ILLNESS:  Kenneth Woodward is a 84 y.o. year old male with multiple medical problems including multi-focal hepatocellular carcinoma (not surgical candidate) s/p prophylactic emolization and BPH. Palliative Care was asked to help with symptom management and to address goals of care.   Interim History: patient reports "feeling good"; appetite is good and weight is stable; seen by GI for malignant ascites; started on oral furosemide; reports no abdominal pain or distention  ASSESSMENT/RECOMMENDATIONS and PLAN:  Hepatocellular carcinoma (multiple lesions noted on CT scan in Nov 2017; patient declined further evaluation) Repeat CT scan 04/04/2018 with significant enlarging of lesions; Bx done 04/27/2018; path reported hepatocellular carcinoma -Fatigue, nausea, anorexia, night sweats, dyspnea on exertion s/p procedure (now improving) -patient s/p prophylactic embolization on 06/13/18 followed by two weeks of fatigue, nausea,anorexia etc -all symptoms have improved and patients oral intake is almost back to baseline (eats 3 meals a day) and takes in some liquid protein supplements; minimal weight loss initially; now stable   Abdominal Ascites (most likely multifactorial) due to liver cancer and hypoproteinemia  - evaluated by GI; oral furosemide started -no abdominal pain or distention  Urinary Frequency with incomplete bladder emptying  -continue flomax  ACP -full code   I spent 30 minutes providing this consultation,  from 11:00 to 11:30. More than 50% of the time in this consultation was spent coordinating communication.    CODE STATUS: TBD PPS: 80% HOSPICE ELIGIBILITY/DIAGNOSIS: TBD  PAST  MEDICAL HISTORY: History reviewed. No pertinent past medical history.  SOCIAL HX:  Social History   Tobacco Use  . Smoking status: Former Smoker    Last attempt to quit: 10/04/1978    Years since quitting: 39.9  . Smokeless tobacco: Never Used  Substance Use Topics  . Alcohol use: Yes    Alcohol/week: 3.0 standard drinks    Types: 3 Glasses of wine per week    ALLERGIES:  Allergies  Allergen Reactions  . Adhesive [Tape] Other (See Comments)    Band-aids irritate skin. Ok to use gauze with paper tape     PERTINENT MEDICATIONS:  No outpatient encounter medications on file as of 08/23/2018.   No facility-administered encounter medications on file as of 08/23/2018.     PHYSICAL EXAM:   General: Well-nourished, well-developed; well groomed in NAD Cardiovascular: regular rate and rhythm Pulmonary: clear ant fields Abdomen: softly distended; no pain  GU: reports no suprapubic tenderness Extremities: no edema, no joint deformities Skin: no rashes of exposed skin Neurological: A&Ox4; CN II-XII grossly intact; walks without assistance; no focal deficits noted  Kenneth Feeback G [place], NP

## 2018-10-16 ENCOUNTER — Telehealth: Payer: Self-pay | Admitting: *Deleted

## 2018-10-16 NOTE — Telephone Encounter (Signed)
Wife called to request phone # for Maxwell Caul with Palliative Care. Called Hospice and obtained [phone], option 2. Provided wife with the number.

## 2018-10-18 ENCOUNTER — Telehealth: Payer: Self-pay

## 2018-10-18 NOTE — Telephone Encounter (Signed)
Late entry for 10/16/2018. Received VM from patient's wife requesting a return call from palliative care. Returned call VM left. Received a follow up VM from patient's wife. Return VM left

## 2018-10-18 NOTE — Telephone Encounter (Signed)
Received a message from [facility] with cancer center regarding patient's wife attempting to contact Palliative care. Provided contact number. Attempted to contact patient's wife and number sounded like a fax when connected.

## 2018-10-19 ENCOUNTER — Telehealth: Payer: Self-pay

## 2018-10-19 ENCOUNTER — Other Ambulatory Visit: Payer: Medicare Other | Admitting: Nurse Practitioner

## 2018-10-19 DIAGNOSIS — N401 Enlarged prostate with lower urinary tract symptoms: Secondary | ICD-10-CM

## 2018-10-19 DIAGNOSIS — R180 Malignant ascites: Secondary | ICD-10-CM

## 2018-10-19 DIAGNOSIS — R630 Anorexia: Secondary | ICD-10-CM

## 2018-10-19 DIAGNOSIS — Z515 Encounter for palliative care: Secondary | ICD-10-CM

## 2018-10-19 DIAGNOSIS — R530 Neoplastic (malignant) related fatigue: Secondary | ICD-10-CM

## 2018-10-19 DIAGNOSIS — R350 Frequency of micturition: Secondary | ICD-10-CM

## 2018-10-19 NOTE — Telephone Encounter (Signed)
Phone call placed to [place], patient's wife. Message left

## 2018-10-19 NOTE — Telephone Encounter (Signed)
email sent to offer to schedule visit with Palliative care

## 2018-10-19 NOTE — Telephone Encounter (Signed)
Visit scheduled for today at 1 pm

## 2018-10-24 NOTE — Progress Notes (Signed)
    PALLIATIVE CARE CONSULT VISIT   PATIENT NAME: Kenneth Woodward DOB: [birth year] MRN: [id number]  PRIMARY CARE PROVIDER:   Chesley Noon, MD  REFERRING PROVIDER:  Chesley Noon, MD [place], [place] [address]  RESPONSIBLE PARTY:   Kenneth Woodward (spouse)   HISTORY OF PRESENT ILLNESS:  Kenneth Woodward is a 85 y.o. year old male with multiple medical problems including multi-focal hepatocellular carcinoma (not surgical candidate) s/p prophylactic emolization and BPH. Palliative Care was asked to help with symptom management and to address goals of care.   Interim History: per wife patient is  weaker; gait slightly unsteady; says abdominal girth has increased but they have not measured.  ASSESSMENT/RECOMMENDATIONS and PLAN:  Hepatocellular carcinoma (multiple lesions noted on CT scan in Nov 2017; patient declined further evaluation) Repeat CT scan 04/04/2018 with significant enlarging of lesions; Bx done 04/27/2018; path reported hepatocellular carcinoma -Fatigue, nausea, anorexia, night sweats, dyspnea on exertion s/p procedure (now improving) -patient s/p prophylactic embolization on 06/13/18 followed by two weeks of fatigue, nausea,anorexia etc -patient generally weaker   Abdominal Ascites (most likely multifactorial) due to liver cancer and hypoproteinemia  - per wife no pain, nausea or vomiting.  Urinary Frequency with incomplete bladder emptying  -continue flomax  ACP -TBD   I spent 45 minutes providing this consultation,  from 13:00 to 13:45. More than 50% of the time in this consultation was spent coordinating communication.    CODE STATUS: TBD PPS: 60% HOSPICE ELIGIBILITY/DIAGNOSIS: wife interested in hospice  PAST MEDICAL HISTORY: No past medical history on file.  SOCIAL HX:  Social History   Tobacco Use  . Smoking status: Former Smoker    Last attempt to quit: 10/04/1978    Years since quitting: 40.0  . Smokeless tobacco: Never Used  Substance Use  Topics  . Alcohol use: Yes    Alcohol/week: 3.0 standard drinks    Types: 3 Glasses of wine per week    ALLERGIES:  Allergies  Allergen Reactions  . Adhesive [Tape] Other (See Comments)    Band-aids irritate skin. Ok to use gauze with paper tape     PERTINENT MEDICATIONS:  No outpatient encounter medications on file as of 10/19/2018.   No facility-administered encounter medications on file as of 10/19/2018.      Kenneth Woodward G [place], NP

## 2019-01-03 DEATH — deceased

## 2020-06-01 IMAGING — CT CT CHEST W/O CM
2 of 4 series · 15 of 36 positions shown, 18 images · non-contrast
Comparison: Outside hospital CT abdomen/pelvis dated 04/04/2018

CLINICAL DATA: Newly diagnosed hepatocellular carcinoma, shortness
of breath with exertion x2 years

EXAM:
CT CHEST WITHOUT CONTRAST
TECHNIQUE: Multidetector CT imaging of the chest was performed following the
standard protocol without IV contrast.

[Series 2: thorax · axial · 0.80mm/px · z∈[-398,-86]mm · 12 of 184 slices shown, 15 images]
[im 14/184  mediastinal]
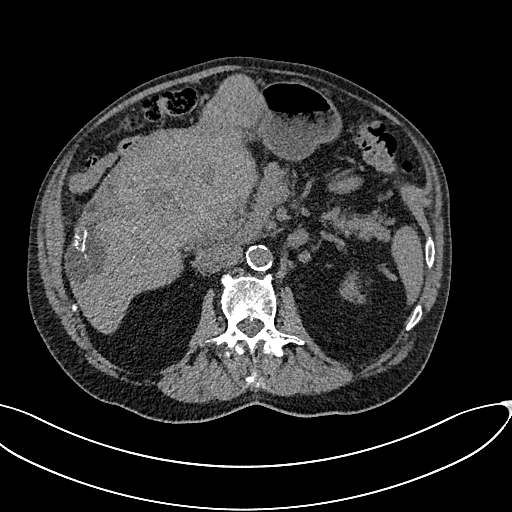
[im 14/184  lung]
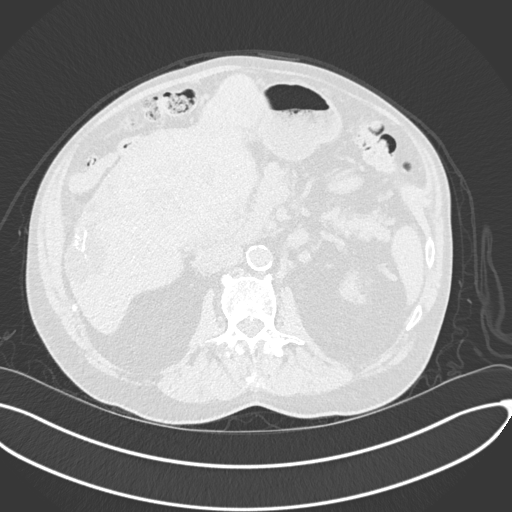
[im 27/184  lung]
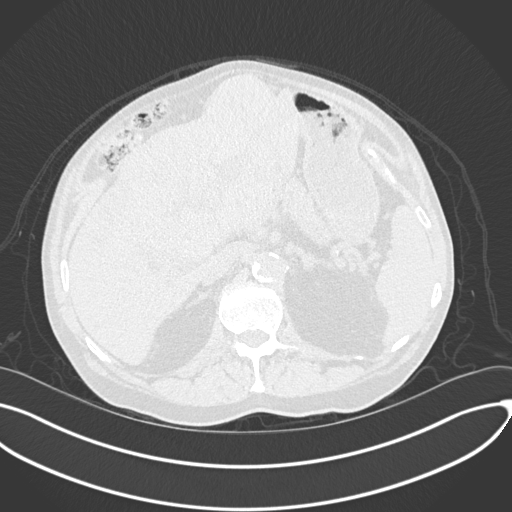
[im 40/184  lung]
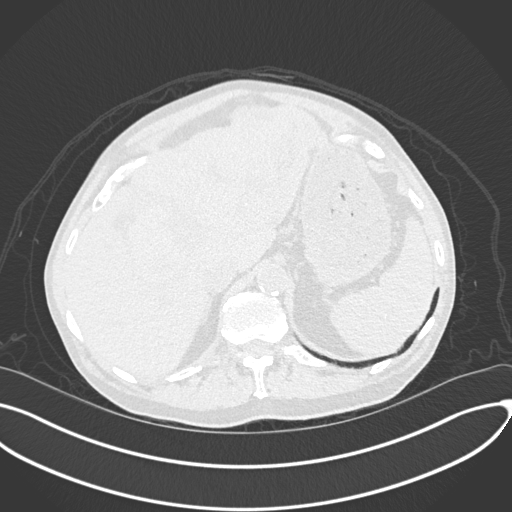
[im 53/184  lung]
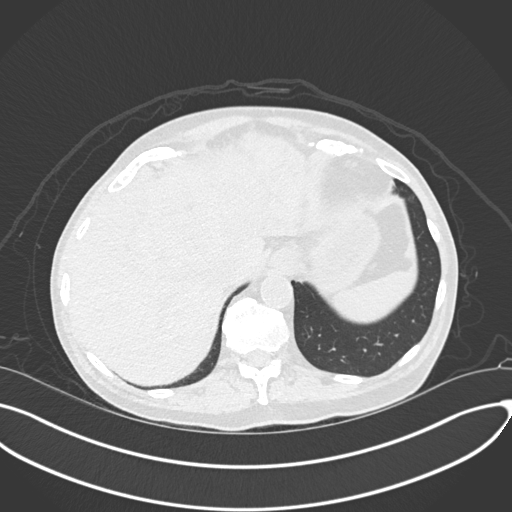
[im 66/184  mediastinal]
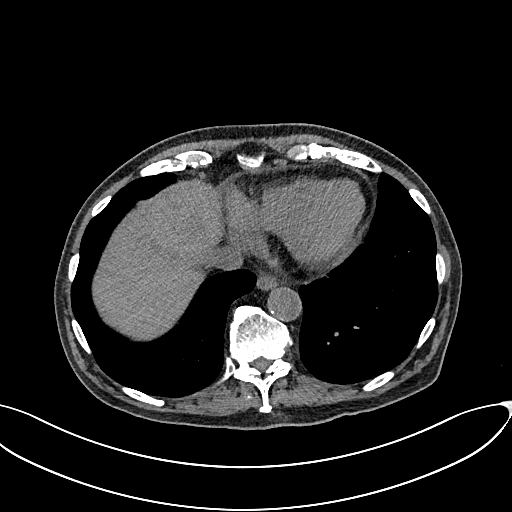
[im 66/184  lung]
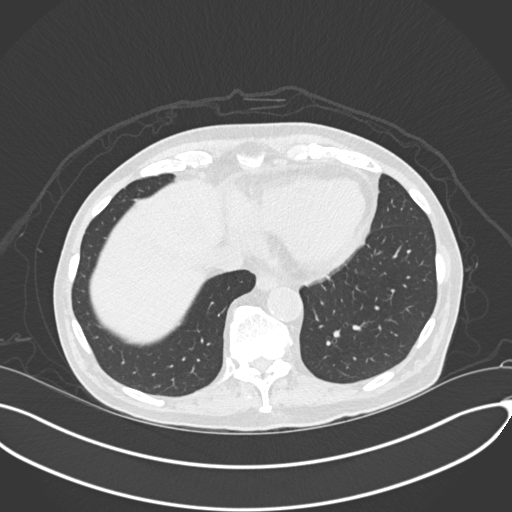
[im 79/184  lung]
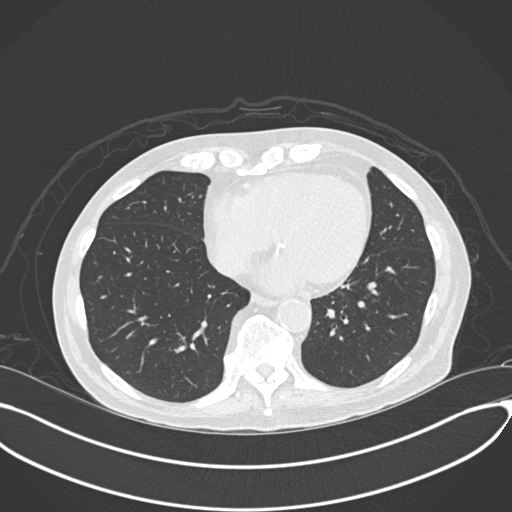
[im 105/184  lung]
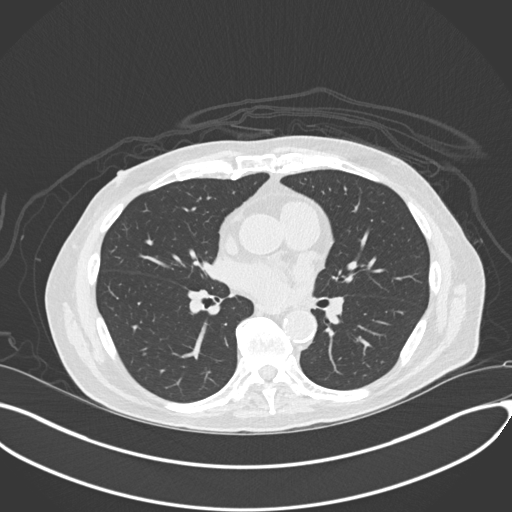
[im 118/184  lung]
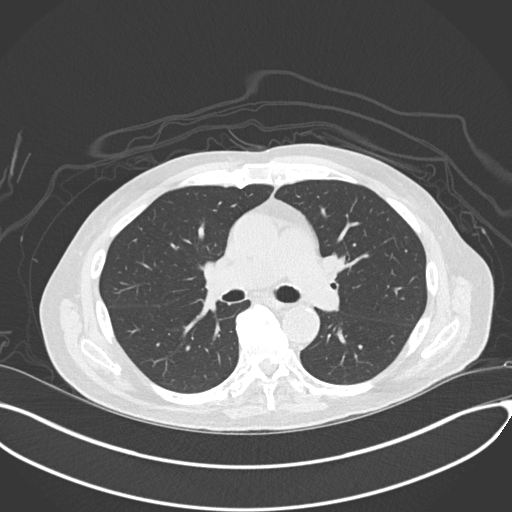
[im 131/184  mediastinal]
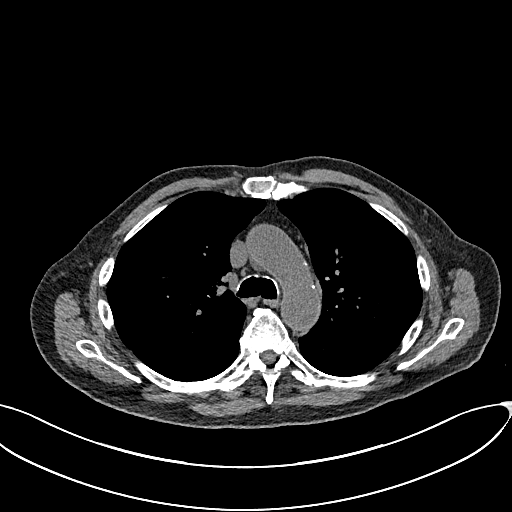
[im 131/184  lung]
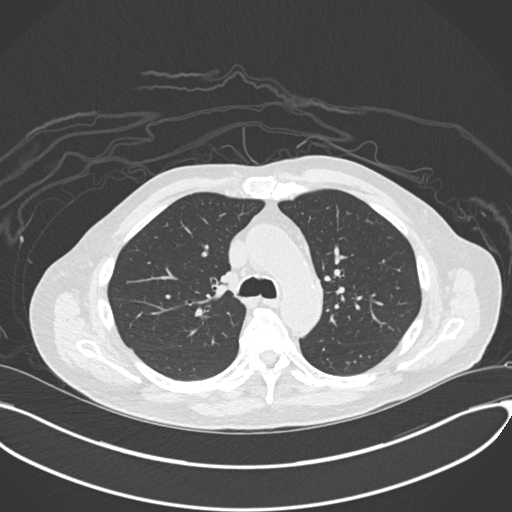
[im 144/184  lung]
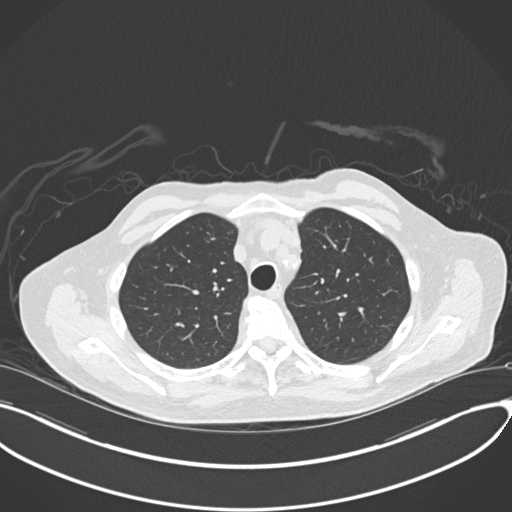
[im 157/184  lung]
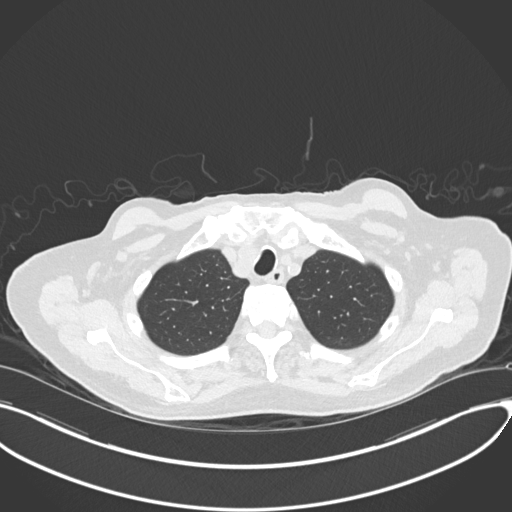
[im 170/184  lung]
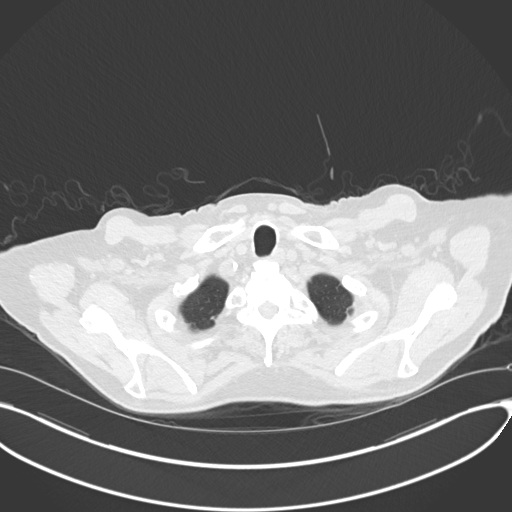

[Series 6: coronal · coronal · 0.76mm/px · 3 of 149 slices shown]
[im 30/149  lung]
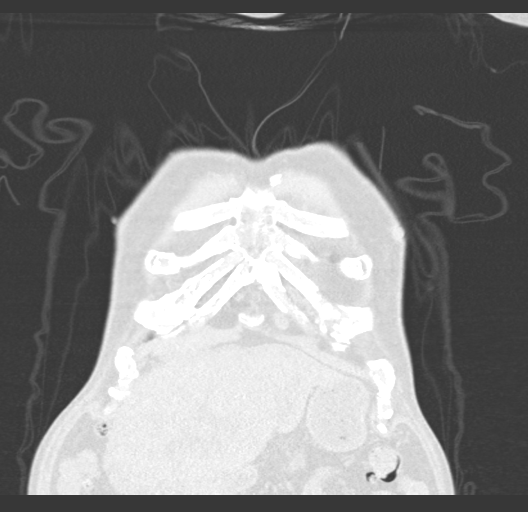
[im 60/149  lung]
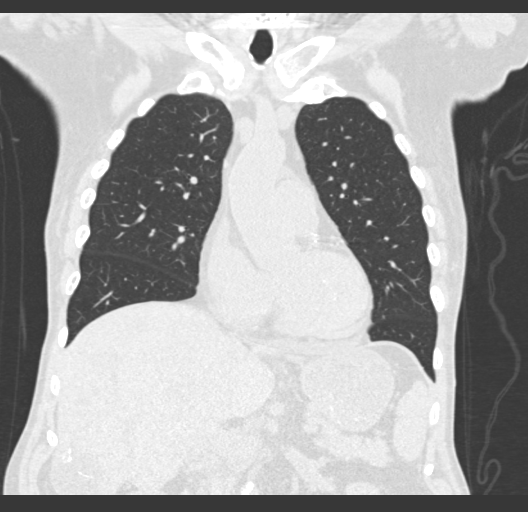
[im 89/149  lung]
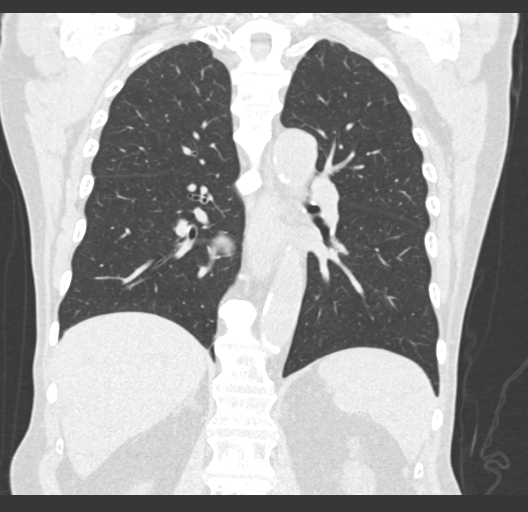

[15 of 36 positions shown; findings below may reference images not displayed]

FINDINGS: Cardiovascular: Heart is normal in size.  No pericardial effusion.

No evidence of thoracic aortic aneurysm. Atherosclerotic
calcifications of the aortic arch.

Three vessel coronary atherosclerosis.

Mediastinum/Nodes: Small mediastinal lymph nodes which do not meet
pathologic CT size criteria.

Visualized thyroid is unremarkable.

Lungs/Pleura: No suspicious pulmonary nodules.

Calcified granuloma in the posterior right upper lobe (series
5/image 44), benign.

No focal consolidation.

Mild biapical pleural-parenchymal scarring.

No pleural effusion or pneumothorax.

Upper Abdomen: Visualized upper abdomen is notable for a nodular
hepatic contour with multifocal hepatic masses in the left hepatic
lobe, including a dominant aggregate mass measuring approximately
18.0 x 11.3 cm in axial dimension, similar to the prior although
poorly evaluated on unenhanced CT.

Musculoskeletal: Degenerative changes of the visualized
thoracolumbar spine.
IMPRESSION: No evidence of metastatic disease in the chest.

Multifocal hepatic masses, poorly evaluated on unenhanced CT but
grossly unchanged.

Aortic Atherosclerosis ([id number]).

## 2020-07-01 IMAGING — NM NM MISC PROCEDURE
1 series · 6 of 6 positions shown · non-contrast
Comparison: Angiogram 06/09/2018, CT a abdomen 08/04/2016, CT
abdomen 04/04/2018

:
CLINICAL DATA: Hepatocellular carcinoma.

EXAM:
NUCLEAR MEDICINE LIVER SCAN
TECHNIQUE: Abdominal images were obtained in multiple projections after
intrahepatic arterial injection of radiopharmaceutical. SPECT
imaging was performed. Lung shunt calculation was performed.
RADIOPHARMACEUTICALS: 5.2millicurie MAA TECHNETIUM TO 99M ALBUMIN
AGGREGATED

[Series 2: maa spect · 4.14mm/px · 6 of 64 frames shown]
[frame 6/64]
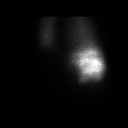
[frame 16/64]
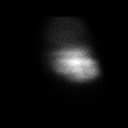
[frame 27/64]
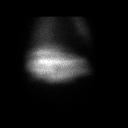
[frame 38/64]
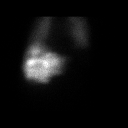
[frame 48/64]
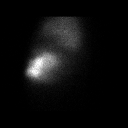
[frame 59/64]
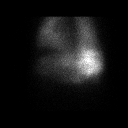

[6 of 6 positions shown; findings below may reference images not displayed]

FINDINGS: The injected microaggregated albumin localizes within the liver. No
evidence of activity within the stomach, duodenum, or bowel.
Majority activity is peripheral around the tumor.

Calculated shunt fraction to the lungs equals 37%.
IMPRESSION: 1. No significant extrahepatic radiotracer activity following
intrahepatic arterial injection of MAA.
2. Lung shunt fraction equals 37%

Findings conveyed Yadieth Carides on 06/09/2018 at[DATE]
